# Patient Record
Sex: Male | Born: 1990 | Race: White | Hispanic: No | Marital: Single | Smoking: Current every day smoker
Health system: Southern US, Community
[De-identification: ages and names within clinical notes are randomized; demographics above are authoritative.]

## PROBLEM LIST (undated history)

## (undated) VITALS — BP 132/68 | HR 76 | Temp 98.0°F | Resp 16

## (undated) DIAGNOSIS — R0602 Shortness of breath: Secondary | ICD-10-CM

## (undated) DIAGNOSIS — I10 Essential (primary) hypertension: Secondary | ICD-10-CM

## (undated) DIAGNOSIS — F1920 Other psychoactive substance dependence, uncomplicated: Secondary | ICD-10-CM

## (undated) DIAGNOSIS — F419 Anxiety disorder, unspecified: Secondary | ICD-10-CM

## (undated) HISTORY — PX: NO PAST SURGERIES: SHX2092

## (undated) HISTORY — PX: INCISION / DRAINAGE HAND / FINGER: SUR695

## (undated) HISTORY — DX: Other psychoactive substance dependence, uncomplicated: F19.20

---
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
## 2009-12-01 ENCOUNTER — Emergency Department (HOSPITAL_COMMUNITY): Admission: EM | Admit: 2009-12-01 | Discharge: 2009-12-01 | Payer: Self-pay | Admitting: Family Medicine

## 2009-12-06 ENCOUNTER — Emergency Department (HOSPITAL_COMMUNITY): Admission: EM | Admit: 2009-12-06 | Discharge: 2009-12-06 | Payer: Self-pay | Admitting: Family Medicine

## 2010-03-13 ENCOUNTER — Emergency Department (HOSPITAL_COMMUNITY)
Admission: EM | Admit: 2010-03-13 | Discharge: 2010-03-13 | Disposition: A | Discharge status: Home / Self Care | Payer: Worker's Compensation | Attending: Emergency Medicine | Admitting: Emergency Medicine

## 2010-03-13 DIAGNOSIS — X19XXXA Contact with other heat and hot substances, initial encounter: Secondary | ICD-10-CM | POA: Insufficient documentation

## 2010-03-13 DIAGNOSIS — T23239A Burn of second degree of unspecified multiple fingers (nail), not including thumb, initial encounter: Secondary | ICD-10-CM | POA: Insufficient documentation

## 2010-03-13 DIAGNOSIS — Y929 Unspecified place or not applicable: Secondary | ICD-10-CM | POA: Insufficient documentation

## 2010-03-13 DIAGNOSIS — IMO0002 Reserved for concepts with insufficient information to code with codable children: Secondary | ICD-10-CM | POA: Insufficient documentation

## 2010-04-19 LAB — CULTURE, ROUTINE-ABSCESS
Culture: NO GROWTH
Gram Stain: NONE SEEN

## 2010-05-17 LAB — ANAEROBIC CULTURE

## 2010-05-17 LAB — WOUND CULTURE

## 2010-05-17 LAB — CBC
HCT: 35.7 % — ABNORMAL LOW (ref 39.0–52.0)
HCT: 43 % (ref 39.0–52.0)
Hemoglobin: 15.2 g/dL (ref 13.0–17.0)
MCV: 90.3 fL (ref 78.0–100.0)
Platelets: 157 10*3/uL (ref 150–400)
RBC: 3.95 MIL/uL — ABNORMAL LOW (ref 4.22–5.81)
RBC: 4.81 MIL/uL (ref 4.22–5.81)
WBC: 15.2 10*3/uL — ABNORMAL HIGH (ref 4.0–10.5)
WBC: 9.3 10*3/uL (ref 4.0–10.5)

## 2010-05-17 LAB — COMPREHENSIVE METABOLIC PANEL
BUN: 8 mg/dL (ref 6–23)
CO2: 26 mEq/L (ref 19–32)
Calcium: 9.4 mg/dL (ref 8.4–10.5)
Creatinine, Ser: 0.99 mg/dL (ref 0.4–1.5)
GFR calc non Af Amer: >60 mL/min (ref >60)
Glucose, Bld: 103 mg/dL — ABNORMAL HIGH (ref 70–99)
Total Protein: 7.7 g/dL (ref 6.0–8.3)

## 2010-05-17 LAB — DIFFERENTIAL
Eosinophils Relative: 0 % (ref 0–5)
Lymphocytes Relative: 4 % — ABNORMAL LOW (ref 12–46)
Lymphs Abs: 0.7 10*3/uL (ref 0.7–4.0)
Monocytes Relative: 8 % (ref 3–12)
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
## 2010-09-24 ENCOUNTER — Emergency Department (HOSPITAL_COMMUNITY)
Admission: EM | Admit: 2010-09-24 | Discharge: 2010-09-24 | Disposition: A | Discharge status: Home / Self Care | Payer: Managed Care, Other (non HMO) | Attending: Emergency Medicine | Admitting: Emergency Medicine

## 2010-09-24 DIAGNOSIS — R112 Nausea with vomiting, unspecified: Secondary | ICD-10-CM | POA: Insufficient documentation

## 2010-09-24 LAB — POCT I-STAT, CHEM 8
BUN: 11 mg/dL (ref 6–23)
Calcium, Ion: 1.16 mmol/L (ref 1.12–1.32)
Creatinine, Ser: 0.9 mg/dL (ref 0.50–1.35)
TCO2: 23 mmol/L (ref 0–100)

## 2010-12-05 ENCOUNTER — Inpatient Hospital Stay (INDEPENDENT_AMBULATORY_CARE_PROVIDER_SITE_OTHER)
Admission: RE | Admit: 2010-12-05 | Discharge: 2010-12-05 | Disposition: A | Discharge status: Home / Self Care | Payer: Managed Care, Other (non HMO) | Source: Ambulatory Visit | Attending: Family Medicine | Admitting: Family Medicine

## 2010-12-05 DIAGNOSIS — J029 Acute pharyngitis, unspecified: Secondary | ICD-10-CM

## 2010-12-05 LAB — POCT URINALYSIS DIP (DEVICE)
Bilirubin Urine: NEGATIVE
Glucose, UA: NEGATIVE mg/dL
Hgb urine dipstick: NEGATIVE
Specific Gravity, Urine: <=1.005 (ref 1.005–1.030)

## 2011-06-12 ENCOUNTER — Encounter (HOSPITAL_COMMUNITY): Payer: Self-pay | Admitting: *Deleted

## 2011-06-12 ENCOUNTER — Emergency Department (HOSPITAL_COMMUNITY)
Admission: EM | Admit: 2011-06-12 | Discharge: 2011-06-13 | Disposition: A | Discharge status: Home / Self Care | Payer: Managed Care, Other (non HMO) | Attending: Emergency Medicine | Admitting: Emergency Medicine

## 2011-06-12 DIAGNOSIS — R109 Unspecified abdominal pain: Secondary | ICD-10-CM | POA: Insufficient documentation

## 2011-06-12 DIAGNOSIS — R112 Nausea with vomiting, unspecified: Secondary | ICD-10-CM | POA: Insufficient documentation

## 2011-06-12 LAB — COMPREHENSIVE METABOLIC PANEL
ALT: 42 U/L (ref 0–53)
AST: 36 U/L (ref 0–37)
Albumin: 5.9 g/dL — ABNORMAL HIGH (ref 3.5–5.2)
Alkaline Phosphatase: 102 U/L (ref 39–117)
Calcium: 11.1 mg/dL — ABNORMAL HIGH (ref 8.4–10.5)
GFR calc Af Amer: >90 mL/min (ref >90)
Glucose, Bld: 150 mg/dL — ABNORMAL HIGH (ref 70–99)
Potassium: 3.6 mEq/L (ref 3.5–5.1)
Sodium: 140 mEq/L (ref 135–145)
Total Protein: 9.5 g/dL — ABNORMAL HIGH (ref 6.0–8.3)

## 2011-06-12 LAB — DIFFERENTIAL
Basophils Absolute: 0 10*3/uL (ref 0.0–0.1)
Basophils Relative: 0 % (ref 0–1)
Eosinophils Absolute: 0 10*3/uL (ref 0.0–0.7)
Eosinophils Relative: 0 % (ref 0–5)
Lymphs Abs: 1.6 10*3/uL (ref 0.7–4.0)
Neutrophils Relative %: 88 % — ABNORMAL HIGH (ref 43–77)

## 2011-06-12 LAB — CBC
MCH: 31.5 pg (ref 26.0–34.0)
MCHC: 36.9 g/dL — ABNORMAL HIGH (ref 30.0–36.0)
MCV: 85.3 fL (ref 78.0–100.0)
Platelets: 250 10*3/uL (ref 150–400)
RBC: 5.65 MIL/uL (ref 4.22–5.81)
RDW: 13.5 % (ref 11.5–15.5)

## 2011-06-12 LAB — URINALYSIS, ROUTINE W REFLEX MICROSCOPIC
Bilirubin Urine: NEGATIVE
Glucose, UA: NEGATIVE mg/dL
Ketones, ur: NEGATIVE mg/dL
Nitrite: NEGATIVE
Specific Gravity, Urine: 1.003 — ABNORMAL LOW (ref 1.005–1.030)
pH: 6.5 (ref 5.0–8.0)

## 2011-06-12 MED ORDER — ONDANSETRON HCL 4 MG/2ML IJ SOLN
4.0000 mg | Freq: Once | INTRAMUSCULAR | Status: AC
  Administered 2011-06-13: 4 mg via INTRAVENOUS
  Filled 2011-06-12: qty 2

## 2011-06-12 MED ORDER — ONDANSETRON 4 MG PO TBDP
8.0000 mg | ORAL_TABLET | Freq: Once | ORAL | Status: AC
  Administered 2011-06-12: 8 mg via ORAL
  Filled 2011-06-12: qty 2

## 2011-06-12 MED ORDER — SODIUM CHLORIDE 0.9 % IV BOLUS (SEPSIS)
1000.0000 mL | Freq: Once | INTRAVENOUS | Status: AC
  Administered 2011-06-13: 1000 mL via INTRAVENOUS

## 2011-06-12 NOTE — ED Notes (Signed)
abd pain na v and diarrhea since 1100am today

## 2011-06-13 ENCOUNTER — Encounter (HOSPITAL_COMMUNITY): Payer: Self-pay | Admitting: Radiology

## 2011-06-13 ENCOUNTER — Emergency Department (HOSPITAL_COMMUNITY): Payer: Managed Care, Other (non HMO)

## 2011-06-13 MED ORDER — PROMETHAZINE HCL 25 MG PO TABS: 25.0000 mg | ORAL_TABLET | Freq: Four times a day (QID) | ORAL | Status: DC | PRN

## 2011-06-13 MED ORDER — HYDROMORPHONE HCL PF 1 MG/ML IJ SOLN
1.0000 mg | Freq: Once | INTRAMUSCULAR | Status: AC
  Administered 2011-06-13: 1 mg via INTRAVENOUS
  Filled 2011-06-13: qty 1

## 2011-06-13 NOTE — ED Notes (Signed)
Patient with abdominal pain, more into flank area on left since 11am yesterday.  Patient having spasms and cramps and pain, radiating to right.  Patient does have some nausea and vomiting.

## 2011-06-13 NOTE — ED Provider Notes (Signed)
History     CSN: [id number]  Arrival date & time 06/12/11  2131   First MD Initiated Contact with Patient 06/12/11 2358      Chief Complaint  Patient presents with  . Emesis    (Consider location/radiation/quality/duration/timing/severity/associated sxs/prior treatment) HPI History provided by patient and his mother bedside. Since 11 AM has had nausea and vomiting with left-sided abdominal cramping and pains. Pain is severe and not radiating.  No hematuria. No bilious or bloody emesis. No diarrhea. No history of similar symptoms. No known sick contacts. No fevers. No medications. No recent travel. No recent antibiotics. Pain comes and goes. He tried over-the-counter antinausea medications without relief. No known bad food exposures.   History reviewed. No pertinent past medical history.  History reviewed. No pertinent past surgical history.  No family history on file.  History  Substance Use Topics  . Smoking status: Current Everyday Smoker  . Smokeless tobacco: Not on file  . Alcohol Use: No      Review of Systems  Constitutional: Negative for fever and chills.  HENT: Negative for sore throat, neck pain and neck stiffness.   Eyes: Negative for pain.  Respiratory: Negative for shortness of breath.   Cardiovascular: Negative for chest pain.  Gastrointestinal: Positive for nausea, vomiting and abdominal pain. Negative for blood in stool.  Genitourinary: Positive for flank pain. Negative for dysuria.  Musculoskeletal: Negative for back pain.  Skin: Negative for rash.  Neurological: Negative for headaches.  All other systems reviewed and are negative.    Allergies  Review of patient's allergies indicates no known allergies.  Home Medications   Current Outpatient Rx  Name Route Sig Dispense Refill  . ANTI-NAUSEA PO Oral Take 1 tablet by mouth as needed. For nausea      BP 129/60  Pulse 56  Temp(Src) 98.1 F (36.7 C) (Oral)  Resp 22  SpO2 99%  Physical Exam    Constitutional: He is oriented to person, place, and time. He appears well-developed and well-nourished.  HENT:  Head: Normocephalic and atraumatic.  Eyes: Conjunctivae and EOM are normal. Pupils are equal, round, and reactive to light.  Neck: Trachea normal. Neck supple. No thyromegaly present.  Cardiovascular: Normal rate, regular rhythm, S1 normal, S2 normal and normal pulses.     No systolic murmur is present   No diastolic murmur is present  Pulses:      Radial pulses are 2+ on the right side, and 2+ on the left side.  Pulmonary/Chest: Effort normal and breath sounds normal. He has no wheezes. He has no rhonchi. He has no rales. He exhibits no tenderness.  Abdominal: Soft. Normal appearance and bowel sounds are normal. He exhibits no mass. There is no tenderness. There is no rebound, no guarding, no CVA tenderness and negative Murphy's sign.       Localizes discomfort to left flank without reproducible tenderness, writhing on the bed unable to get comfortable  Musculoskeletal:       BLE:s Calves nontender, no cords or erythema, negative Homans sign  Neurological: He is alert and oriented to person, place, and time. He has normal strength. No cranial nerve deficit or sensory deficit. GCS eye subscore is 4. GCS verbal subscore is 5. GCS motor subscore is 6.  Skin: Skin is warm and dry. No rash noted. He is not diaphoretic.  Psychiatric: His speech is normal.       Cooperative and appropriate    ED Course  Procedures (including critical care time)  IV fluids. IV Dilaudid. CT scan obtained and reviewed as below. On recheck at 2:30 AM is feeling significantly better without pain and no emesis in ED. Nausea resolved.  Results for orders placed during the hospital encounter of 06/12/11  CBC      Component Value Range   WBC 19.9 (*) 4.0 - 10.5 (K/uL)   RBC 5.65  4.22 - 5.81 (MIL/uL)   Hemoglobin 17.8 (*) 13.0 - 17.0 (g/dL)   HCT 16.1  09.6 - 04.5 (%)   MCV 85.3  78.0 - 100.0 (fL)    MCH 31.5  26.0 - 34.0 (pg)   MCHC 36.9 (*) 30.0 - 36.0 (g/dL)   RDW 40.9  81.1 - 91.4 (%)   Platelets 250  150 - 400 (K/uL)  DIFFERENTIAL      Component Value Range   Neutrophils Relative 88 (*) 43 - 77 (%)   Neutro Abs 17.4 (*) 1.7 - 7.7 (K/uL)   Lymphocytes Relative 8 (*) 12 - 46 (%)   Lymphs Abs 1.6  0.7 - 4.0 (K/uL)   Monocytes Relative 4  3 - 12 (%)   Monocytes Absolute 0.8  0.1 - 1.0 (K/uL)   Eosinophils Relative 0  0 - 5 (%)   Eosinophils Absolute 0.0  0.0 - 0.7 (K/uL)   Basophils Relative 0  0 - 1 (%)   Basophils Absolute 0.0  0.0 - 0.1 (K/uL)  COMPREHENSIVE METABOLIC PANEL      Component Value Range   Sodium 140  135 - 145 (mEq/L)   Potassium 3.6  3.5 - 5.1 (mEq/L)   Chloride 96  96 - 112 (mEq/L)   CO2 17 (*) 19 - 32 (mEq/L)   Glucose, Bld 150 (*) 70 - 99 (mg/dL)   BUN 20  6 - 23 (mg/dL)   Creatinine, Ser 7.82  0.50 - 1.35 (mg/dL)   Calcium 95.6 (*) 8.4 - 10.5 (mg/dL)   Total Protein 9.5 (*) 6.0 - 8.3 (g/dL)   Albumin 5.9 (*) 3.5 - 5.2 (g/dL)   AST 36  0 - 37 (U/L)   ALT 42  0 - 53 (U/L)   Alkaline Phosphatase 102  39 - 117 (U/L)   Total Bilirubin 0.3  0.3 - 1.2 (mg/dL)   GFR calc non Af Amer 89 (*) >90 (mL/min)   GFR calc Af Amer >90  >90 (mL/min)  URINALYSIS, ROUTINE W REFLEX MICROSCOPIC      Component Value Range   Color, Urine YELLOW  YELLOW    APPearance CLEAR  CLEAR    Specific Gravity, Urine 1.003 (*) 1.005 - 1.030    pH 6.5  5.0 - 8.0    Glucose, UA NEGATIVE  NEGATIVE (mg/dL)   Hgb urine dipstick NEGATIVE  NEGATIVE    Bilirubin Urine NEGATIVE  NEGATIVE    Ketones, ur NEGATIVE  NEGATIVE (mg/dL)   Protein, ur NEGATIVE  NEGATIVE (mg/dL)   Urobilinogen, UA 0.2  0.0 - 1.0 (mg/dL)   Nitrite NEGATIVE  NEGATIVE    Leukocytes, UA NEGATIVE  NEGATIVE    Ct Abdomen Pelvis Wo Contrast  06/13/2011  *RADIOLOGY REPORT*  Clinical Data: Left flank pain.  CT ABDOMEN AND PELVIS WITHOUT CONTRAST  Technique:  Multidetector CT imaging of the abdomen and pelvis was performed  following the standard protocol without intravenous contrast.  Comparison: None.  Findings: No focal abnormalities seen in the liver or spleen.  The stomach, duodenum, pancreas, gallbladder, and adrenal glands are unremarkable.  No stones are seen in either kidney.  No secondary changes are seen in either kidney.  No ureteral or bladder stones.  No abdominal aortic aneurysm.  There is no free fluid or lymphadenopathy in the abdomen.  Abdominal bowel loops are unremarkable.  Imaging through the pelvis shows no free intraperitoneal fluid.  No pelvic sidewall lymphadenopathy.  Colon is unremarkable aside from a few scattered diverticuli.  No colonic diverticulitis.  The terminal ileum is normal.  The appendix is normal.  Bone windows reveal no worrisome lytic or sclerotic osseous lesions.  IMPRESSION: No acute findings in the abdomen or pelvis.  No findings to explain the patient's history of left flank pain.  Original Report Authenticated By: ERIC A. MANSELL, M.D.       MDM   Nausea vomiting with abdominal flank pain. Evaluated with labs and CT scan as above. On recheck is much improved and requesting to be discharged home. Prescription for Zofran provided. Reliable historian verbalizes understanding strict return precautions for any worsening condition and agrees to discharge and followup instructions. Plan clear liquids advance to brat diet. Return here for persistent abdominal pain. PCP referral provided        Sunnie Nielsen, MD 06/13/11 [phone]

## 2011-06-13 NOTE — ED Notes (Signed)
Patient able to keep oral fluids and ice chips down.  No nausea or vomiting.

## 2011-06-13 NOTE — Discharge Instructions (Signed)
Abdominal Pain Abdominal pain can be caused by many things. Your caregiver decides the seriousness of your pain by an examination and possibly blood tests and X-rays. Many cases can be observed and treated at home. Most abdominal pain is not caused by a disease and will probably improve without treatment. However, in many cases, more time must pass before a clear cause of the pain can be found. Before that point, it may not be known if you need more testing, or if hospitalization or surgery is needed. HOME CARE INSTRUCTIONS   Do not take laxatives unless directed by your caregiver.   Take pain medicine only as directed by your caregiver.   Only take over-the-counter or prescription medicines for pain, discomfort, or fever as directed by your caregiver.   Try a clear liquid diet (broth, tea, or water) for as long as directed by your caregiver. Slowly move to a bland diet as tolerated.  SEEK IMMEDIATE MEDICAL CARE IF:   The pain does not go away.   You have a fever.   You keep throwing up (vomiting).   The pain is felt only in portions of the abdomen. Pain in the right side could possibly be appendicitis. In an adult, pain in the left lower portion of the abdomen could be colitis or diverticulitis.   You pass bloody or black tarry stools.  MAKE SURE YOU:   Understand these instructions.   Will watch your condition.   Will get help right away if you are not doing well or get worse.    RESOURCE GUIDE  No Primary Care Doctor Call [facility]  [phone] Other agencies that provide inexpensive medical care    [facility]  [phone]    [facility]  [phone]    [facility]  [phone]    [facility]  [phone]    [facility]  [phone]    [facility]  [phone]

## 2011-06-27 ENCOUNTER — Emergency Department (HOSPITAL_COMMUNITY)
Admission: EM | Admit: 2011-06-27 | Discharge: 2011-06-27 | Disposition: A | Discharge status: Home / Self Care | Payer: Managed Care, Other (non HMO) | Attending: Emergency Medicine | Admitting: Emergency Medicine

## 2011-06-27 ENCOUNTER — Encounter (HOSPITAL_COMMUNITY): Payer: Self-pay | Admitting: Emergency Medicine

## 2011-06-27 DIAGNOSIS — R112 Nausea with vomiting, unspecified: Secondary | ICD-10-CM | POA: Insufficient documentation

## 2011-06-27 DIAGNOSIS — R109 Unspecified abdominal pain: Secondary | ICD-10-CM | POA: Insufficient documentation

## 2011-06-27 DIAGNOSIS — F1210 Cannabis abuse, uncomplicated: Secondary | ICD-10-CM | POA: Insufficient documentation

## 2011-06-27 DIAGNOSIS — F17200 Nicotine dependence, unspecified, uncomplicated: Secondary | ICD-10-CM | POA: Insufficient documentation

## 2011-06-27 LAB — CBC
HCT: 47 % (ref 39.0–52.0)
Hemoglobin: 16.8 g/dL (ref 13.0–17.0)
MCH: 31 pg (ref 26.0–34.0)
RBC: 5.42 MIL/uL (ref 4.22–5.81)

## 2011-06-27 LAB — URINE MICROSCOPIC-ADD ON

## 2011-06-27 LAB — DIFFERENTIAL
Eosinophils Absolute: 0 10*3/uL (ref 0.0–0.7)
Lymphs Abs: 1.7 10*3/uL (ref 0.7–4.0)
Monocytes Absolute: 1.1 10*3/uL — ABNORMAL HIGH (ref 0.1–1.0)
Monocytes Relative: 6 % (ref 3–12)
Neutro Abs: 16.9 10*3/uL — ABNORMAL HIGH (ref 1.7–7.7)
Neutrophils Relative %: 86 % — ABNORMAL HIGH (ref 43–77)

## 2011-06-27 LAB — RAPID URINE DRUG SCREEN, HOSP PERFORMED
Barbiturates: NOT DETECTED
Benzodiazepines: POSITIVE — AB

## 2011-06-27 LAB — COMPREHENSIVE METABOLIC PANEL
Alkaline Phosphatase: 88 U/L (ref 39–117)
BUN: 15 mg/dL (ref 6–23)
Chloride: 104 mEq/L (ref 96–112)
Creatinine, Ser: 1.17 mg/dL (ref 0.50–1.35)
GFR calc Af Amer: >90 mL/min (ref >90)
Glucose, Bld: 123 mg/dL — ABNORMAL HIGH (ref 70–99)
Potassium: 3.5 mEq/L (ref 3.5–5.1)
Total Bilirubin: 0.3 mg/dL (ref 0.3–1.2)

## 2011-06-27 LAB — URINALYSIS, ROUTINE W REFLEX MICROSCOPIC
Ketones, ur: >80 mg/dL — AB
Nitrite: NEGATIVE
Protein, ur: 100 mg/dL — AB
Urobilinogen, UA: 0.2 mg/dL (ref 0.0–1.0)

## 2011-06-27 LAB — LIPASE, BLOOD: Lipase: 23 U/L (ref 11–59)

## 2011-06-27 MED ORDER — ONDANSETRON HCL 4 MG/2ML IJ SOLN
4.0000 mg | Freq: Once | INTRAMUSCULAR | Status: AC
  Administered 2011-06-27: 4 mg via INTRAVENOUS
  Filled 2011-06-27: qty 2

## 2011-06-27 MED ORDER — HYDROMORPHONE HCL PF 1 MG/ML IJ SOLN
1.0000 mg | Freq: Once | INTRAMUSCULAR | Status: AC
  Administered 2011-06-27: 1 mg via INTRAVENOUS
  Filled 2011-06-27: qty 1

## 2011-06-27 MED ORDER — SODIUM CHLORIDE 0.9 % IV SOLN
INTRAVENOUS | Status: DC
Start: 2011-06-27 — End: 2011-06-28
  Administered 2011-06-27: 18:00:00 via INTRAVENOUS

## 2011-06-27 MED ORDER — ONDANSETRON HCL 4 MG/2ML IJ SOLN
INTRAMUSCULAR | Status: AC
  Administered 2011-06-27: 4 mg
  Filled 2011-06-27: qty 2

## 2011-06-27 MED ORDER — OXYCODONE-ACETAMINOPHEN 5-325 MG PO TABS: 1.0000 | ORAL_TABLET | ORAL | Status: AC | PRN

## 2011-06-27 MED ORDER — HYDROMORPHONE HCL PF 1 MG/ML IJ SOLN
0.5000 mg | Freq: Once | INTRAMUSCULAR | Status: AC
  Administered 2011-06-27: 0.5 mg via INTRAVENOUS
  Filled 2011-06-27: qty 1

## 2011-06-27 MED ORDER — SODIUM CHLORIDE 0.9 % IV BOLUS (SEPSIS)
2000.0000 mL | Freq: Once | INTRAVENOUS | Status: AC
  Administered 2011-06-27: 2000 mL via INTRAVENOUS

## 2011-06-27 NOTE — ED Notes (Signed)
Pt is actively dry heaving. 

## 2011-06-27 NOTE — ED Notes (Signed)
b

## 2011-06-27 NOTE — ED Notes (Signed)
Pt presenting to ed with c/o abdominal pain with positive nausea and vomiting and small amount of diarrhea since 5:00am. Pt is alert and oriented at this time. Pt states he was here for the same x 1 week ago.

## 2011-06-27 NOTE — ED Provider Notes (Signed)
History     CSN: [id number]  Arrival date & time 06/27/11  1531   First MD Initiated Contact with Patient 06/27/11 1645      Chief Complaint  Patient presents with  . Abdominal Pain  . Nausea    (Consider location/radiation/quality/duration/timing/severity/associated sxs/prior treatment) HPI Comments: Carlos Lester is a 21 y.o. Male who presents with nausea, vomiting, and abdominal pain that started at 5 AM today. He was in the ED on 06/12/11 with a similar problem. He did not have recurrence after the discharge until today. He denies use of illegal substances, alcohol or tobacco. He denies fever, or back pain, cough, shortness of breath, or chest pain. He tried Phenergan at home, but was unable to keep down.  Patient is a 21 y.o. male presenting with abdominal pain. The history is provided by the patient.  Abdominal Pain The primary symptoms of the illness include abdominal pain.    History reviewed. No pertinent past medical history.  History reviewed. No pertinent past surgical history.  No family history on file.  History  Substance Use Topics  . Smoking status: Current Everyday Smoker -- 1.0 packs/day  . Smokeless tobacco: Not on file  . Alcohol Use: No      Review of Systems  Gastrointestinal: Positive for abdominal pain.  All other systems reviewed and are negative.    Allergies  Review of patient's allergies indicates no known allergies.  Home Medications   Current Outpatient Rx  Name Route Sig Dispense Refill  . PROMETHAZINE HCL 25 MG PO TABS Oral Take 25 mg by mouth every 6 (six) hours as needed. For nausea.    . OXYCODONE-ACETAMINOPHEN 5-325 MG PO TABS Oral Take 1 tablet by mouth every 4 (four) hours as needed for pain. 20 tablet 0    BP 122/62  Pulse 84  Temp(Src) 98.2 F (36.8 C) (Axillary)  Resp 17  SpO2 98%  Physical Exam  Nursing note and vitals reviewed. Constitutional: He is oriented to person, place, and time. He appears  well-developed and well-nourished.  HENT:  Head: Normocephalic and atraumatic.  Right Ear: External ear normal.  Left Ear: External ear normal.  Eyes: Conjunctivae and EOM are normal. Pupils are equal, round, and reactive to light.  Neck: Normal range of motion and phonation normal. Neck supple.  Cardiovascular: Normal rate, regular rhythm, normal heart sounds and intact distal pulses.   Pulmonary/Chest: Effort normal and breath sounds normal. He exhibits no bony tenderness.  Abdominal: Soft. Normal appearance and bowel sounds are normal. He exhibits no distension. There is no tenderness. There is no guarding.       No abdominal tenderness  Musculoskeletal: Normal range of motion.  Neurological: He is alert and oriented to person, place, and time. He has normal strength. No cranial nerve deficit or sensory deficit. He exhibits normal muscle tone. Coordination normal.  Skin: Skin is warm, dry and intact.  Psychiatric: His behavior is normal. Judgment and thought content normal.       Anxious    ED Course  Procedures (including critical care time)  Patient was treated with IV fluids, Dilaudid, and Zosyn..  Patient improved in his pain. He was asked about the positive drug screen for benzodiazepines and states that he took a single, Xanax, a friend gave him. He denies ongoing Xanax abuse.  Labs Reviewed  CBC - Abnormal; Notable for the following:    WBC 19.7 (*)    All other components within normal limits  DIFFERENTIAL -  Abnormal; Notable for the following:    Neutrophils Relative 86 (*)    Neutro Abs 16.9 (*)    Lymphocytes Relative 9 (*)    Monocytes Absolute 1.1 (*)    All other components within normal limits  COMPREHENSIVE METABOLIC PANEL - Abnormal; Notable for the following:    CO2 17 (*)    Glucose, Bld 123 (*)    Total Protein 8.5 (*)    GFR calc non Af Amer 88 (*)    All other components within normal limits  URINALYSIS, ROUTINE W REFLEX MICROSCOPIC - Abnormal;  Notable for the following:    Color, Urine AMBER (*) BIOCHEMICALS MAY BE AFFECTED BY COLOR   APPearance CLOUDY (*)    Specific Gravity, Urine 1.032 (*)    Bilirubin Urine SMALL (*)    Ketones, ur >80 (*)    Protein, ur 100 (*)    Leukocytes, UA TRACE (*)    All other components within normal limits  URINE RAPID DRUG SCREEN (HOSP PERFORMED) - Abnormal; Notable for the following:    Benzodiazepines POSITIVE (*)    Tetrahydrocannabinol POSITIVE (*)    All other components within normal limits  URINE MICROSCOPIC-ADD ON - Abnormal; Notable for the following:    Casts HYALINE CASTS (*)    All other components within normal limits  LIPASE, BLOOD  LAB REPORT - SCANNED   No results found.   1. Abdominal pain       MDM  Nonspecific recurrent abdominal pain, with recent negative CAT scan and negative. ED evaluation today. Doubt colitis, appendicitis, urinary tract infection, metabolic instability or serious bacterial infection. He is stable for discharge.   Plan: Home Medications- Percocet; Home Treatments- gradual diet increase; Recommended follow up- GI f/u      Flint Melter, MD 06/28/11 1539

## 2011-06-27 NOTE — Discharge Instructions (Signed)

## 2011-06-27 NOTE — ED Notes (Signed)
[id number]> Expected date:<BR> Expected time: 3:22 PM<BR> Means of arrival:<BR> Comments:<BR> M11 - 20yoM RLQ Abd pain, vomiting

## 2011-06-27 NOTE — ED Notes (Signed)
Blood work drawn, 1 blue, 1 light green, 1 lavender, 1 dark green drawn

## 2011-06-27 NOTE — ED Notes (Signed)
Patient aware of need for urine specimen. Patient unable to void at this time. Patient given urinal. Encouraged to call for assistance if needed.   

## 2011-10-01 ENCOUNTER — Emergency Department (HOSPITAL_COMMUNITY)
Admission: EM | Admit: 2011-10-01 | Discharge: 2011-10-01 | Disposition: A | Discharge status: Home / Self Care | Payer: Managed Care, Other (non HMO) | Attending: Emergency Medicine | Admitting: Emergency Medicine

## 2011-10-01 ENCOUNTER — Inpatient Hospital Stay (HOSPITAL_COMMUNITY)
Admission: RE | Admit: 2011-10-01 | Discharge: 2011-10-06 | DRG: 897 | Disposition: A | Discharge status: Home / Self Care | Payer: Managed Care, Other (non HMO) | Attending: Psychiatry | Admitting: Psychiatry

## 2011-10-01 ENCOUNTER — Encounter (HOSPITAL_COMMUNITY): Payer: Self-pay | Admitting: *Deleted

## 2011-10-01 ENCOUNTER — Encounter (HOSPITAL_COMMUNITY): Payer: Self-pay | Admitting: Licensed Clinical Social Worker

## 2011-10-01 DIAGNOSIS — F1310 Sedative, hypnotic or anxiolytic abuse, uncomplicated: Secondary | ICD-10-CM | POA: Insufficient documentation

## 2011-10-01 DIAGNOSIS — F1410 Cocaine abuse, uncomplicated: Secondary | ICD-10-CM | POA: Insufficient documentation

## 2011-10-01 DIAGNOSIS — F1020 Alcohol dependence, uncomplicated: Secondary | ICD-10-CM | POA: Diagnosis present

## 2011-10-01 DIAGNOSIS — F17200 Nicotine dependence, unspecified, uncomplicated: Secondary | ICD-10-CM | POA: Insufficient documentation

## 2011-10-01 DIAGNOSIS — F3289 Other specified depressive episodes: Secondary | ICD-10-CM | POA: Diagnosis present

## 2011-10-01 DIAGNOSIS — F1910 Other psychoactive substance abuse, uncomplicated: Secondary | ICD-10-CM | POA: Diagnosis present

## 2011-10-01 DIAGNOSIS — F909 Attention-deficit hyperactivity disorder, unspecified type: Secondary | ICD-10-CM | POA: Diagnosis present

## 2011-10-01 DIAGNOSIS — F1994 Other psychoactive substance use, unspecified with psychoactive substance-induced mood disorder: Principal | ICD-10-CM | POA: Diagnosis present

## 2011-10-01 DIAGNOSIS — I10 Essential (primary) hypertension: Secondary | ICD-10-CM | POA: Insufficient documentation

## 2011-10-01 DIAGNOSIS — F329 Major depressive disorder, single episode, unspecified: Secondary | ICD-10-CM | POA: Diagnosis present

## 2011-10-01 DIAGNOSIS — F19939 Other psychoactive substance use, unspecified with withdrawal, unspecified: Secondary | ICD-10-CM | POA: Diagnosis not present

## 2011-10-01 DIAGNOSIS — F1210 Cannabis abuse, uncomplicated: Secondary | ICD-10-CM | POA: Insufficient documentation

## 2011-10-01 DIAGNOSIS — Z0000 Encounter for general adult medical examination without abnormal findings: Secondary | ICD-10-CM

## 2011-10-01 DIAGNOSIS — F1320 Sedative, hypnotic or anxiolytic dependence, uncomplicated: Secondary | ICD-10-CM | POA: Diagnosis present

## 2011-10-01 HISTORY — DX: Anxiety disorder, unspecified: F41.9

## 2011-10-01 HISTORY — DX: Shortness of breath: R06.02

## 2011-10-01 HISTORY — DX: Essential (primary) hypertension: I10

## 2011-10-01 LAB — RAPID URINE DRUG SCREEN, HOSP PERFORMED
Barbiturates: NOT DETECTED
Cocaine: POSITIVE — AB
Tetrahydrocannabinol: POSITIVE — AB

## 2011-10-01 LAB — URINALYSIS, ROUTINE W REFLEX MICROSCOPIC
Bilirubin Urine: NEGATIVE
Hgb urine dipstick: NEGATIVE
Ketones, ur: NEGATIVE mg/dL
Specific Gravity, Urine: 1.035 — ABNORMAL HIGH (ref 1.005–1.030)
Urobilinogen, UA: 0.2 mg/dL (ref 0.0–1.0)

## 2011-10-01 LAB — CBC
HCT: 43.5 % (ref 39.0–52.0)
MCHC: 36.1 g/dL — ABNORMAL HIGH (ref 30.0–36.0)
RDW: 13.2 % (ref 11.5–15.5)

## 2011-10-01 LAB — BASIC METABOLIC PANEL
BUN: 16 mg/dL (ref 6–23)
GFR calc Af Amer: >90 mL/min (ref >90)
GFR calc non Af Amer: >90 mL/min (ref >90)
Potassium: 3.4 mEq/L — ABNORMAL LOW (ref 3.5–5.1)

## 2011-10-01 MED ORDER — ACETAMINOPHEN 325 MG PO TABS
650.0000 mg | ORAL_TABLET | Freq: Once | ORAL | Status: AC
  Administered 2011-10-01: 650 mg via ORAL
  Filled 2011-10-01: qty 2

## 2011-10-01 MED ORDER — NICOTINE 14 MG/24HR TD PT24: 14.0000 mg | MEDICATED_PATCH | Freq: Once | TRANSDERMAL | Status: DC

## 2011-10-01 MED ORDER — NICOTINE 21 MG/24HR TD PT24
MEDICATED_PATCH | TRANSDERMAL | Status: AC
  Administered 2011-10-01: 21 mg
  Filled 2011-10-01: qty 1

## 2011-10-01 NOTE — ED Notes (Signed)
Pt sts he has been taking benzos for 5 years and he is tired of living "this lifestyle." When asked what type he takes, he sts "anything that I can get my hands on." Patient sts the last time he took anything was 2 am this morning.
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
## 2011-10-01 NOTE — ED Notes (Signed)
Report called to [facility] and [place] states ok to send pt over.  Security notified and pt waiting for transfer

## 2011-10-02 ENCOUNTER — Encounter (HOSPITAL_COMMUNITY): Payer: Self-pay | Admitting: *Deleted

## 2011-10-02 DIAGNOSIS — F1320 Sedative, hypnotic or anxiolytic dependence, uncomplicated: Secondary | ICD-10-CM | POA: Diagnosis present

## 2011-10-02 LAB — HEPATIC FUNCTION PANEL
Albumin: 4.4 g/dL (ref 3.5–5.2)
Bilirubin, Direct: 0.1 mg/dL (ref 0.0–0.3)
Total Bilirubin: 0.4 mg/dL (ref 0.3–1.2)

## 2011-10-02 LAB — ACETAMINOPHEN LEVEL: Acetaminophen (Tylenol), Serum: <15 ug/mL (ref 10–30)

## 2011-10-02 LAB — SALICYLATE LEVEL: Salicylate Lvl: <2 mg/dL — ABNORMAL LOW (ref 2.8–20.0)

## 2011-10-02 LAB — TSH: TSH: 1.691 u[IU]/mL (ref 0.350–4.500)

## 2011-10-02 MED ORDER — CHLORDIAZEPOXIDE HCL 25 MG PO CAPS
25.0000 mg | ORAL_CAPSULE | Freq: Four times a day (QID) | ORAL | Status: AC | PRN
  Administered 2011-10-02 – 2011-10-04 (×3): 25 mg via ORAL
  Filled 2011-10-02 (×2): qty 1

## 2011-10-02 MED ORDER — ADULT MULTIVITAMIN W/MINERALS CH
1.0000 | ORAL_TABLET | Freq: Every day | ORAL | Status: DC
  Administered 2011-10-02: 1 via ORAL
  Filled 2011-10-02 (×2): qty 1

## 2011-10-02 MED ORDER — MAGNESIUM HYDROXIDE 400 MG/5ML PO SUSP: 30.0000 mL | Freq: Every day | ORAL | Status: DC | PRN

## 2011-10-02 MED ORDER — ONDANSETRON 4 MG PO TBDP: 4.0000 mg | ORAL_TABLET | Freq: Four times a day (QID) | ORAL | Status: AC | PRN

## 2011-10-02 MED ORDER — CITALOPRAM HYDROBROMIDE 20 MG PO TABS
20.0000 mg | ORAL_TABLET | Freq: Every day | ORAL | Status: DC
  Administered 2011-10-02 – 2011-10-06 (×5): 20 mg via ORAL
  Filled 2011-10-02 (×8): qty 1

## 2011-10-02 MED ORDER — HYDROXYZINE HCL 25 MG PO TABS
25.0000 mg | ORAL_TABLET | Freq: Four times a day (QID) | ORAL | Status: DC | PRN
  Administered 2011-10-02 – 2011-10-03 (×4): 25 mg via ORAL

## 2011-10-02 MED ORDER — CHLORDIAZEPOXIDE HCL 25 MG PO CAPS
25.0000 mg | ORAL_CAPSULE | Freq: Four times a day (QID) | ORAL | Status: AC
  Administered 2011-10-02 (×3): 25 mg via ORAL
  Filled 2011-10-02 (×3): qty 1

## 2011-10-02 MED ORDER — VITAMIN B-1 100 MG PO TABS
100.0000 mg | ORAL_TABLET | Freq: Every day | ORAL | Status: DC
  Filled 2011-10-02: qty 1

## 2011-10-02 MED ORDER — VITAMIN B-1 100 MG PO TABS
100.0000 mg | ORAL_TABLET | Freq: Every day | ORAL | Status: DC
  Administered 2011-10-03 – 2011-10-06 (×4): 100 mg via ORAL
  Filled 2011-10-02 (×6): qty 1

## 2011-10-02 MED ORDER — ACETAMINOPHEN 325 MG PO TABS
650.0000 mg | ORAL_TABLET | Freq: Four times a day (QID) | ORAL | Status: DC | PRN
  Administered 2011-10-02 – 2011-10-03 (×4): 650 mg via ORAL

## 2011-10-02 MED ORDER — LOPERAMIDE HCL 2 MG PO CAPS: 2.0000 mg | ORAL_CAPSULE | ORAL | Status: AC | PRN

## 2011-10-02 MED ORDER — ADULT MULTIVITAMIN W/MINERALS CH
1.0000 | ORAL_TABLET | Freq: Every day | ORAL | Status: DC
  Administered 2011-10-03 – 2011-10-06 (×4): 1 via ORAL
  Filled 2011-10-02 (×7): qty 1

## 2011-10-02 MED ORDER — TRAZODONE HCL 50 MG PO TABS
50.0000 mg | ORAL_TABLET | Freq: Once | ORAL | Status: AC
  Administered 2011-10-02: 50 mg via ORAL
  Filled 2011-10-02 (×2): qty 1

## 2011-10-02 MED ORDER — NICOTINE 21 MG/24HR TD PT24
21.0000 mg | MEDICATED_PATCH | Freq: Every day | TRANSDERMAL | Status: DC
  Administered 2011-10-02 – 2011-10-05 (×5): 21 mg via TRANSDERMAL
  Filled 2011-10-02 (×7): qty 1

## 2011-10-02 MED ORDER — ALUM & MAG HYDROXIDE-SIMETH 200-200-20 MG/5ML PO SUSP
30.0000 mL | ORAL | Status: DC | PRN
  Administered 2011-10-03: 30 mL via ORAL

## 2011-10-02 MED ORDER — CHLORDIAZEPOXIDE HCL 25 MG PO CAPS
25.0000 mg | ORAL_CAPSULE | Freq: Three times a day (TID) | ORAL | Status: AC
  Administered 2011-10-03 (×3): 25 mg via ORAL
  Filled 2011-10-02 (×4): qty 1

## 2011-10-02 MED ORDER — TRAZODONE HCL 50 MG PO TABS
50.0000 mg | ORAL_TABLET | Freq: Every evening | ORAL | Status: DC | PRN
  Administered 2011-10-02 – 2011-10-05 (×5): 50 mg via ORAL
  Filled 2011-10-02 (×13): qty 1

## 2011-10-02 MED ORDER — CHLORDIAZEPOXIDE HCL 25 MG PO CAPS
25.0000 mg | ORAL_CAPSULE | Freq: Every day | ORAL | Status: AC
  Administered 2011-10-05: 25 mg via ORAL
  Filled 2011-10-02: qty 1

## 2011-10-02 MED ORDER — CHLORDIAZEPOXIDE HCL 25 MG PO CAPS
25.0000 mg | ORAL_CAPSULE | ORAL | Status: AC
  Administered 2011-10-04 (×2): 25 mg via ORAL
  Filled 2011-10-02 (×2): qty 1

## 2011-10-02 MED ORDER — THIAMINE HCL 100 MG/ML IJ SOLN: 100.0000 mg | Freq: Once | INTRAMUSCULAR | Status: DC

## 2011-10-02 NOTE — Progress Notes (Signed)
Pt reported poor sleep at 2-3 hours.  He stated,"It put me to sleep but didn't keep me asleep"  It was recorded as 4 hours per MHT checks.  He rated his depression hopelessness and anxiety all an 8 on huis self-inventory.  He reports appetite as poor energy low but his ability to pay attention as good.  He denied any symptoms of withdrawal except for agitation and cravings.  As the day progressed, he was started on the librium protocol which he feels has been helpful.  His CIWA was a 3 at 1800.  He denied any S/I or A/V hallucinations.  He is open to to  The idea of going into long-term treatment if possible.  He is unsure what he can do at this point for he has some pending court dates.  Dr. Kirtland Bouchard asked him to talk with his lawyer to see what they could work out then we would go from there.  Pt did voice understanding.

## 2011-10-02 NOTE — Progress Notes (Signed)
[facility] Group Notes:  (Counselor/Nursing/MHT/Case Management/Adjunct)  10/02/2011 2:12 PM  Type of Therapy:  Psychoeducational Skills  Participation Level:  Minimal  Participation Quality:  Appropriate and Attentive  Affect:  Blunted  Cognitive:  Appropriate and Oriented  Insight:  Limited  Engagement in Group:  Limited  Engagement in Therapy:  n/a  Modes of Intervention:  Activity, Education, Problem-solving, Socialization and Support  Summary of Progress/Problems: Carlos Lester attended Psychoeducational group that focused on using quality time with support systems/individuals to engage in healthy coping skills. Carlos Lester participated in activity guessing about self and peers. Carlos Lester was minimally active while group discussed who their supports are, how they can spend positive quality time with them as a coping skill and a way to strengthen their relationship. [name] was given a homework assignment to find two ways to improve his support systems and 20 activities he can do to spend quality time with supports.      Wandra Scot 10/02/2011, 2:12 PM

## 2011-10-02 NOTE — Progress Notes (Signed)
Patient ID: Carlos Lester, male   DOB: [birth year], 21 y.o.   MRN: [id number]  Pt was pleasant but anxious during the adm process. Pt stated that he's also depressed. Pt informed the writer that he's been abusing benzo's, cocaine, and THC. Pt informed the writer that he has crt date pending Sept 6 for possession of stolen veh, poss of stolen goods, larceny of a veh, and DUI, "I'm just tired of being tired".  Locker 15 used for belongings.

## 2011-10-02 NOTE — Progress Notes (Signed)
Psychoeducational Group Note  Date:  10/02/2011 Time:  1100  Group Topic/Focus:  Recovery Goals:   The focus of this group is to identify appropriate goals for recovery and establish a plan to achieve them.  Participation Level:  Active  Participation Quality:  Appropriate and Attentive  Affect:  Appropriate  Cognitive:  Appropriate  Insight:  Good  Engagement in Group:  Good  Additional Comments: Pt participated in recovery goals group. Pt defined what recovery is. Pt also was given a personal recovery goals worksheet. Pt worked on the worksheet and wrote what changes needed to be made and set a goal toward the change. Pt was asked to make specific, measurable goals that would be realistic to accomplish. Pt shared and was cooperative during group.   Karleen Hampshire Brittini 10/02/2011, 1:02 PM

## 2011-10-02 NOTE — Progress Notes (Signed)
Patient ID: Carlos Lester, male   DOB: 11/06/1990, 21 y.o.   MRN: [id number]  D:  Pt approached the writer while the writer was in the room with another pt. "I need to speak with you as soon as you get a chance."  Pt approached writer with his hands outstretched and shaking, saying "I don't know what's wrong with me". Writer encouraged pt to sit down and attempt to discuss what may be causing his issue. Offered prn vistaril. Pt immediately stopped trembling as he walked to the window.  A: Adm prn vistaril, and informed pt that in the future it isn't necessary for him to exaggerate his condition. Pt stated, "The other nurse told me I didn't have any".  Encouragement and support was offered. 15 min checks were continued for safety.  R:  Pt remains safe.

## 2011-10-02 NOTE — ED Provider Notes (Signed)
Medical screening examination/treatment/procedure(s) were performed by non-physician practitioner and as supervising physician I was immediately available for consultation/collaboration.  Toy Baker, MD 10/02/11 [phone]

## 2011-10-02 NOTE — Discharge Planning (Signed)
New patient attended AM group, good participation.  States he has been using alcohol and benzos for the past 4 years.  Only intervention was a day reporting center that was set up through probation that [place] attended twice a week.  He states he was not motivated for change at the time.  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  He has court on Sept 6 on a felony charge, and is interested in the possibility of going to rehab from here.  Has insurance through his father, and says his grandmother is able to meet co-pay.  Will go over possibilities later today.
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
## 2011-10-02 NOTE — Progress Notes (Signed)
10/02/2011         Time: 1500      Group Topic/Focus: The focus of this group is on enhancing patients' problem solving skills, which involves identifying the problem, brainstorming solutions and choosing and trying a solution.  Participation Level: Active  Participation Quality: Attentive  Affect: Blunted  Cognitive: Oriented   Additional Comments: None.    Gwendolyn Nishi 10/02/2011 3:41 PM

## 2011-10-02 NOTE — Tx Team (Signed)
Initial Interdisciplinary Treatment Plan  PATIENT STRENGTHS: (choose at least two) Ability for insight Active sense of humor Average or above average intelligence Communication skills Motivation for treatment/growth Physical Health Religious Affiliation Supportive family/friends Work skills  PATIENT STRESSORS: Legal issue Occupational concerns Substance abuse   PROBLEM LIST: Problem List/Patient Goals Date to be addressed Date deferred Reason deferred Estimated date of resolution  "Quit having to rely on drugs" 10/02/11     "Change the people I hang out with" 10/02/11     Depression 10/02/11     Increased risk for suicide 10/02/11     Substance Abuse 10/02/11                              DISCHARGE CRITERIA:  Ability to meet basic life and health needs Adequate post-discharge living arrangements Improved stabilization in mood, thinking, and/or behavior Medical problems require only outpatient monitoring Motivation to continue treatment in a less acute level of care Need for constant or close observation no longer present Reduction of life-threatening or endangering symptoms to within safe limits Safe-care adequate arrangements made Verbal commitment to aftercare and medication compliance Withdrawal symptoms are absent or subacute and managed without 24-hour nursing intervention  PRELIMINARY DISCHARGE PLAN: Attend aftercare/continuing care group Attend 12-step recovery group Outpatient therapy Participate in family therapy Placement in alternative living arrangements Return to previous living arrangement  PATIENT/FAMIILY INVOLVEMENT: This treatment plan has been presented to and reviewed with the patient, Carlos Lester, and/or family member.  The patient and family have been given the opportunity to ask questions and make suggestions.  Carlos Lester [facility] 10/02/2011, 12:37 AM

## 2011-10-02 NOTE — Progress Notes (Signed)
[facility] Group Notes:  (Counselor/Nursing/MHT/Case Management/Adjunct)  10/02/2011 2:26 PM  Type of Therapy:  Group Therapy  Participation Level:  Minimal  Participation Quality:  Attentive  Affect:  Depressed  Cognitive:  Alert and Oriented  Insight:  None shared  Engagement in Group:  Limited  Engagement in Therapy:  Limited  Modes of Intervention:  Education, Socialization and Support  Summary of Progress/Problems:Patient attended group presentation by Interior and spatial designer of  [facility] [facility] [employer] [facility] [place] ([employer]). Carlos Lester was somewhat attentive during session although he seemed to be experiencing some symptoms of withdrawal.      Clide Dales 10/02/2011, 2:26 PM

## 2011-10-02 NOTE — [facility] Suicide Risk Assessment (Signed)
Suicide Risk Assessment  Admission Assessment     See PAA/H&P completed by this writer upon admission.  Kuroski-Mazzei, Caffie Sotto 10/02/2011, 2:00 PM

## 2011-10-02 NOTE — Treatment Plan (Signed)
Interdisciplinary Treatment Plan Update (Adult)  Date: 10/02/2011  Time Reviewed: 10:39 AM   Progress in Treatment: Attending groups: Yes Participating in groups: Yes Taking medication as prescribed: Yes Tolerating medication: Yes   Family/Significant other contact made: No  Patient understands diagnosis:  Yes  As evidenced by asking help with detox from benzos Discussing patient identified problems/goals with staff:  Yes  See below Medical problems stabilized or resolved:  Yes Denies suicidal/homicidal ideation: Yes  In tx team Issues/concerns per patient self-inventory:  Yes  Poor sleep, Depression, hopelessness and anxiety all 8's.  C/O withdrawal symptoms Other:  New problem(s) identified: N/A  Reason for Continuation of Hospitalization: Anxiety Depression Medication stabilization Withdrawal symptoms  Interventions implemented related to continuation of hospitalization: Librium taper, Celexa trial, encourage group attendance and participation  Additional comments:  Estimated length of stay: 2-3 days  Discharge Plan:  See below  New goal(s): N/A  Review of initial/current patient goals per problem list:   1.  Goal(s): Eliminate SI  Met:  Yes  Target date:8/27  As evidenced [id number] report in tx team  2.  Goal (s): safely detox from benzos  Met:  No  Target date:8/30  As evidenced EA:[id number] vitals, no withdrawal symptoms  3.  Goal(s):Stabilize mood  Met:  No  Target date:8/30  As evidenced [id number] will rate his depression and anxiety at 4's or less  4.  Goal(s): Identify comprehensive sobriety plan  Met:  No  Target date:8/28  As evidenced [id number] is deciding between IOP and rehab  Attendees: Patient: Carlos Lester  10/02/2011 10:39 AM  Family:     Physician:  Lupe Carney 10/02/2011 10:39 AM   Nursing: Robbie Louis   10/02/2011 10:39 AM   Case Manager:  Richelle Ito, LCSW 10/02/2011 10:39 AM   Counselor:  Ronda Fairly, LCSWA  10/02/2011 10:39 AM   Other:     Other:     Other:     Other:      Scribe for Treatment Team:   Ida Rogue, 10/02/2011 10:39 AM

## 2011-10-03 MED ORDER — NAPROXEN 500 MG PO TABS
500.0000 mg | ORAL_TABLET | Freq: Two times a day (BID) | ORAL | Status: DC
  Administered 2011-10-03 – 2011-10-06 (×6): 500 mg via ORAL
  Filled 2011-10-03 (×10): qty 1

## 2011-10-03 MED ORDER — NAPROXEN SODIUM 550 MG PO TABS: 550.0000 mg | ORAL_TABLET | Freq: Two times a day (BID) | ORAL | Status: DC

## 2011-10-03 NOTE — Progress Notes (Addendum)
Patient ID: Carlos Lester, male   DOB: Dec 02, 1990, 21 y.o.   MRN: [id number] D: Pt is awake and active on the unit this PM. Pt denies SI/HI and A/V hallucinations. Pt is participating in the milieu and is cooperative with staff. Pt rates their depression at 8 and hopelessness at 8. Pt's most recent COWS score was 4. Pt mood is restless and his mood is anxious. Pt is somewhat needy but also polite.   A: Writer offered self, utilized therapeutic communication and administered medication per MD orders. Writer also encouraged pt to discuss feelings with staff and attend groups. Pt plan is to attend [facility] after discharge and maintain his sobriety. Pt is requesting Drysol deoderant for excessive axilla diaphoresis.   R: Pt is attending groups and tolerating medications well. Writer will continue to monitor. 15 minute checks are ongoing for safety. Writer passed on pt request.

## 2011-10-03 NOTE — Progress Notes (Deleted)
la 

## 2011-10-03 NOTE — Progress Notes (Signed)
[facility] Group Notes:  (Counselor/Nursing/MHT/Case Management/Adjunct)   Type of Therapy:  Group Therapy from 1:15 to 2:30 PM  Participation Level: Active  Participation Quality:  Appropriate, Attentive and Sharing  Affect:  Appropriate although somewhat depressed  Cognitive:  Appropriate  Insight:  Good  Engagement in Group:  Good  Engagement in Therapy:  Good  Modes of Intervention:  Clarification, Socialization and Support  Summary of Progress/Problems: The focus of this group session was to process how we deal with difficult emotions and share with others the patterns that play out when we are reacting to the emotion verses the situation.  Carlos Lester shared that one of the most difficult emotions he deals with is same for many others, anger. "I find I am a lot more prone to anger when I am using or coming off drugs." Patient agreed that thinking about our problems often tends to make them seem bigger.  Carlos Lester shared good feeling about his discharge to local treatment center.    Carlos Lester 10/03/2011, 7:31 PM

## 2011-10-03 NOTE — Discharge Planning (Signed)
Pt presented with stable mood and reported that he "feels better than yesterday." He changed his mind about going to [facility] and stated an interest in [facility] that his uncle had previously suggested to him. Pt signed information release form; information requested Faxed over; pending approval for admission to [facility]. I targeted Saturday as transfer date.

## 2011-10-03 NOTE — Progress Notes (Signed)
Psychoeducational Group Note  Date:  10/03/2011 Time:  1100  Group Topic/Focus:  Personal Choices and Values:   The focus of this group is to help patients assess and explore the importance of values in their lives, how their values affect their decisions, how they express their values and what opposes their expression.  Participation Level:  Active  Participation Quality:  Appropriate and Attentive  Affect:  Appropriate  Cognitive:  Appropriate  Insight:  Good  Engagement in Group:  Good  Additional Comments:  Pt participated in personal choices and values group. Pt stated what were negative and positive values and choices that have been made in past and present. Pt was given a worksheet on identifying values, where pt circled values on sheet that were important to pt. Pt also completed a worksheet on choosing a value-orientated life, which explains what values would like to be improved and how it could be improved. Pt was cooperative and appropriate during group and shared ideas and thoughts.   Karleen Hampshire Brittini 10/03/2011, 1:29 PM

## 2011-10-03 NOTE — Progress Notes (Signed)
Psychoeducational Group Note  Date:  10/03/2011 Time: 2000 Group Topic/Focus: AA Group .  Participation Level:  Active  Participation Quality:  Appropriate  Affect:  Appropriate  Cognitive:  Appropriate  Insight:  Good  Engagement in Group:  Good  Additional Comments:    Carlos Lester Patience 10/03/2011, 10:28 PM

## 2011-10-03 NOTE — Progress Notes (Signed)
[facility] Adult Inpatient Family/Significant Other Suicide Prevention Education  Suicide Prevention Education:  Education Completed; Laurene Footman at [phone] has been identified by the patient as the family member/significant other with whom the patient will be residing, and identified as the person(s) who will aid the patient in the event of a mental health crisis (suicidal ideations/suicide attempt).  With written consent from the patient, the family member/significant other has been provided the following suicide prevention education, prior to the and/or following the discharge of the patient.  The suicide prevention education provided includes the following:  Suicide risk factors  Suicide prevention and interventions  National Suicide Hotline telephone number  [facility] assessment telephone number  [facility] Emergency Assistance 911  [facility] and/or Residential Mobile Crisis Unit telephone number  Request made of family/significant other to:  Remove weapons (e.g., guns, rifles, knives), all items previously/currently identified as safety concern.    Remove drugs/medications (over-the-counter, prescriptions, illicit drugs), all items previously/currently identified as a safety concern.  The family member/significant other verbalizes understanding of the suicide prevention education information provided.  The family member/significant other agrees to remove the items of safety concern listed above. Ms. Dareen Piano was unable to take number for Mobile Crisis Management thus writer leaving a card for her with son as she will visit Thursday.   Clide Dales 10/03/2011, 1:11 PM

## 2011-10-03 NOTE — Progress Notes (Signed)
[facility] In Patient Progress Note  10/04/2011 10:51 PM AHIJAH DEVERY 03/31/1990 [id number] Diagnosis:  Axis I:   ADL's:  Intact  Sleep:  Yes,  AEB:  Appetite: good  Suicidal Ideation: No suicidal ideation, no plan, no intent, no means.  Homicidal Ideation:  No homicidal ideation, no plan, no intent, no means.  Subjective: patient states that he is feeling good and has no withdrawal symptoms. He is asking about discharge today.  He also reports that he is not taking the clonidine.  oral temperature is 98.2 F (36.8 C). His blood pressure is 113/68 and his pulse is 77. His respiration is 20.  Objective: Speech is clear and goal directed. No withdrawal symptoms reported. He denies SI/HI, no AVH. Mood is euthymic affect is congruent.   Mental Status: alert and active in unit milieu Orientation: x 3 General Appearance : Behavior:  Casual Eye Contact:  Good Motor Behavior:  Normal few tremors Speech:  Normal Level of Consciousness:  Alert Mood:  Euthymic Affect:  Appropriate Anxiety Level:  Minimal Thought Process:  Coherent Thought Content:  WNL Perception:  Normal Judgment:  Fair Insight:  Present Cognition:  At least average Sleep:  Number of Hours: 4.75   Lab Results: No results found for this or any previous visit (from the past 48 hour(s)). Labs are reviewed. Physical Findings: AIMS: Facial and Oral Movements Muscles of Facial Expression: None, normal Lips and Perioral Area: None, normal Jaw: None, normal Tongue: None, normal,Extremity Movements Upper (arms, wrists, hands, fingers): None, normal Lower (legs, knees, ankles, toes): None, normal, Trunk Movements Neck, shoulders, hips: None, normal, Overall Severity Severity of abnormal movements (highest score from questions above): None, normal Incapacitation due to abnormal movements: None, normal Patient's awareness of abnormal movements (rate only patient's report): No Awareness, Dental Status Current problems  with teeth and/or dentures?: No Does patient usually wear dentures?: No  CIWA:  CIWA-Ar Total: 1  COWS:  COWS Total Score: 4    Medication: . chlordiazePOXIDE  25 mg Oral BH-qamhs   Followed by  . chlordiazePOXIDE  25 mg Oral Daily  . citalopram  20 mg Oral Daily  . multivitamin with minerals  1 tablet Oral Daily  . naproxen  500 mg Oral BID WC  . nicotine  21 mg Transdermal Q0600  . QUEtiapine  25 mg Oral TID  . QUEtiapine  50 mg Oral QHS  . thiamine  100 mg Oral Daily  . traZODone  50 mg Oral QHS,MR X 1   Treatment Plan Summary: 1. Patient will continue today and if no further withdrawal symptoms in the next 24 hours he will be able to D/C home on Friday.  Plan: 1. Continue current plan of care as is and monitor WD symptoms. 2.Added Seroquel for sleep and anxiety. Rona Ravens. Grisell Bissette PAC 10/04/2011, 10:51 PM

## 2011-10-03 NOTE — Progress Notes (Signed)
Pt has been up for most groups today.  He reported sleep as fair appetite improving and energy level low.  He rated all his depression hopelessness and anxiety a 8 on his self-inventory.  He is still c/o chilling cravings agitation and body aches.  He denied any S/H ideations or A/ V hallucinations.  He plans to go to [facility] at discharge.  He has had two prn meds today.  Vistaril at 1101 for anxiety and tylenol for headache at 1209 were both given.  He has no new orders thus far.  Marland Kitchen

## 2011-10-04 DIAGNOSIS — F1910 Other psychoactive substance abuse, uncomplicated: Secondary | ICD-10-CM | POA: Diagnosis present

## 2011-10-04 MED ORDER — QUETIAPINE FUMARATE 25 MG PO TABS
25.0000 mg | ORAL_TABLET | Freq: Three times a day (TID) | ORAL | Status: DC
  Administered 2011-10-04 – 2011-10-06 (×6): 25 mg via ORAL
  Filled 2011-10-04 (×12): qty 1

## 2011-10-04 MED ORDER — QUETIAPINE FUMARATE 50 MG PO TABS
50.0000 mg | ORAL_TABLET | Freq: Every day | ORAL | Status: DC
  Administered 2011-10-04 – 2011-10-05 (×2): 50 mg via ORAL
  Filled 2011-10-04 (×4): qty 1

## 2011-10-04 MED ORDER — HYDROXYZINE HCL 50 MG PO TABS
50.0000 mg | ORAL_TABLET | ORAL | Status: DC | PRN
  Administered 2011-10-04 – 2011-10-06 (×8): 50 mg via ORAL

## 2011-10-04 NOTE — Progress Notes (Signed)
Psychoeducational Group Note  Date:  10/04/2011 Time:  1100  Group Topic/Focus:  Rediscovering Joy:   The focus of this group is to explore various ways to relieve stress in a positive manner.  Participation Level:  Active  Participation Quality:  Appropriate, Attentive and Sharing  Affect:  Appropriate  Cognitive:  Appropriate  Insight:  Good  Engagement in Group:  Good  Additional Comments:   Pt participated in rediscovering joy. Pt discussed ways that humor and laughter can have positive benefits for the mind and body. Pt also talked about ways that humor and joy can be apart of everyday life. Pt stated ways to receive joy. Activity was performed during group, on the five senses. Pt was asked within the five sense name one thing that would give joy. Example would be for hearing, listening to the birds chirp. Pt was sharing, cooperative during group.   Karleen Hampshire Brittini 10/04/2011, 1:33 PM

## 2011-10-04 NOTE — Progress Notes (Signed)
D:  Pt about on the unit today.  He was a little agitated over one of his peers this am but went to his room and took a shower and was able to settle down.  He has been attending groups.  He rates his depression as a 7/10 and his hopelessness as a 6/10 .  He denies SI/HI.  He says he has some tremors and some sweating and anxiety and agitation.  He says he hopes to go to [facility] and then [facility] upon discharge.  A;  Offered support and encouragement.  Given meds as prescribed and prn for anxiety.  R:  Receptive to staff.  Remains on q 15 minute checks for safety.  Will continue to monitor.

## 2011-10-04 NOTE — Progress Notes (Signed)
Pt requested to see RN after lunch today... Said he started shaking real bad and he was having trouble catching his breath.  "It just came over me".  RN talked with pt and encouraged him to take some deep, slow breaths and he was able to work through his panic.  Pt says he hopes to be discharged on something that will help his anxiety/panic He says he may be going to [facility].  He says before he came in here he was always self medicating with drugs.  Will continue to follow.  Marland Kitchen

## 2011-10-04 NOTE — Progress Notes (Signed)
D: Pt came to the medication window at the beginning of this shift. He requested for his repeat dose of trazodone and Gatorade; also  requested for Vistaril but it was too early for him to receive another dose of Vistaril.Marland Kitchen His mood and affect  Seemed appropriate. Patient received the trazodone; went to his room and returned about 25 minutes after that  with complaint of  panic attack.   A: Best boy. He ordered Vistaril 50 mg every four hour as needed for anxiety. R: Pt received the medication and returned to his room.

## 2011-10-04 NOTE — Progress Notes (Signed)
10/04/2011         Time: 1500      Group Topic/Focus: The focus of this group is on enhancing the patient's understanding of leisure, barriers to leisure, and the importance of engaging in positive leisure activities upon discharge for improved total health.  Participation Level: Minimal  Participation Quality: Monopolizing and Intrusive  Affect: Labile  Cognitive: Oriented   Additional Comments: Patient requiring constant redirection for cursing, talking over others, inappropriate conversations. Patient reported he couldn't control his behavior because his nurse wouldn't give him any medication- firm limits were set.   Elysse Polidore 10/04/2011 3:40 PM

## 2011-10-04 NOTE — Discharge Planning (Signed)
I spoke with Carlos Lester's atty who stated that court date will likely be continued due to transfer from here to rehab.  [name] expressed relief at this news.

## 2011-10-04 NOTE — Progress Notes (Signed)
D:Pt in day room at the beginning of this shift. He reported that he had a decent day but; "depressed now". Pt stated that his grandma and uncle visited and he became depressed after they left. He is somewhat intrusive and med seeking. He denied SI/HI; but endorsed feeling shaky and dehydrated. He shook his hands and said he had tremors. Patient came to the medication window after that and started saying course words. He requested for Vistaril and Librium. A: Writer Checked pt medication profile; he could only receive Librium. Writer notified patient that it's his last PRN dose of Librium and that it's too early for Vistaril. Limit set as to use words on the unit. Encouraged to attend karaoke. R: Pt received his medications without difficulty. Receptive to corrections; and apologized.

## 2011-10-04 NOTE — Progress Notes (Signed)
[facility] Group Notes:  (Counselor/Nursing/MHT/Case Management/Adjunct)  10/04/2011 3:25 PM  Type of Therapy:  Group Therapy  Participation Level:  Active  Participation Quality:  Appropriate and Attentive  Affect:  Appropriate  Cognitive:  Appropriate  Insight:  Good  Engagement in Group:  Good  Engagement in Therapy:  Good  Modes of Intervention:  Problem-solving, Support and exploration  Summary of Progress/Problems:Pt attended group therapy to process feeling about finding balance in one's life. Pt explored what balance means and looks like in their individual lives, what imbalance looks like and how to adjustment from life of addiction to a life of recovery. Carlos Lester, LPCA     Carlos Lester L 10/04/2011, 3:25 PM

## 2011-10-05 DIAGNOSIS — F1320 Sedative, hypnotic or anxiolytic dependence, uncomplicated: Secondary | ICD-10-CM

## 2011-10-05 DIAGNOSIS — F329 Major depressive disorder, single episode, unspecified: Secondary | ICD-10-CM

## 2011-10-05 DIAGNOSIS — F1020 Alcohol dependence, uncomplicated: Secondary | ICD-10-CM

## 2011-10-05 DIAGNOSIS — F909 Attention-deficit hyperactivity disorder, unspecified type: Secondary | ICD-10-CM

## 2011-10-05 DIAGNOSIS — F1994 Other psychoactive substance use, unspecified with psychoactive substance-induced mood disorder: Principal | ICD-10-CM

## 2011-10-05 MED ORDER — THIAMINE HCL 100 MG PO TABS: 100.0000 mg | ORAL_TABLET | Freq: Every day | ORAL | Status: AC

## 2011-10-05 MED ORDER — CITALOPRAM HYDROBROMIDE 20 MG PO TABS: 20.0000 mg | ORAL_TABLET | Freq: Every day | ORAL | Status: DC

## 2011-10-05 MED ORDER — TRAZODONE HCL 50 MG PO TABS: 50.0000 mg | ORAL_TABLET | Freq: Every evening | ORAL | Status: DC | PRN

## 2011-10-05 MED ORDER — NAPROXEN 500 MG PO TABS: 500.0000 mg | ORAL_TABLET | Freq: Two times a day (BID) | ORAL | Status: DC

## 2011-10-05 MED ORDER — HYDROXYZINE HCL 50 MG PO TABS: 50.0000 mg | ORAL_TABLET | ORAL | Status: AC | PRN

## 2011-10-05 MED ORDER — QUETIAPINE FUMARATE 25 MG PO TABS: ORAL_TABLET | ORAL | Status: DC

## 2011-10-05 NOTE — Progress Notes (Signed)
[facility] Case Management Discharge Plan:  Will you be returning to the same living situation after discharge: No. At discharge, do you have transportation home?:Yes,  uncle is coming to pick up and transport home to get clothes and will transport to [facility] Do you have the ability to pay for your medications:Yes,  [employer] insurance  Interagency Information:     Release of information consent forms completed and in the chart;  Patient's signature needed at discharge.  Patient to Follow up at:  Follow-up Information    Follow up with [facility] on 10/06/2011. (Appointment time at 2:00pm on Saturday, 8/21 )    Contact information:   [address] [place], [place] [address] [phone]         Patient denies SI/HI:   Yes,  yes    Safety Planning and Suicide Prevention discussed:  Yes,  yes  Barrier to discharge identified:No.  Summary and Recommendations:   Smart, Ledell Peoples 10/05/2011, 2:09 PM

## 2011-10-05 NOTE — Discharge Planning (Signed)
Pt attending morning group session. Pt stated that he was feeling better today and slept well last night but woke up sweaty. Pt is "nervous but excited" about attending [facility] tomorrow at 2pm. After session, pt and CM Intern called pt's uncle to make sure uncle agreed to take pt home after discharge to get clothes before admittance to [facility] at 2pm on 8/31. Uncle stated that he was fine with this and will come to pickup pt around 11am tomorrow morning. Aware there may be slight delay in discharge time. Pt wanted to make sure that he will continue to have access to current meds because they seem to be working well for him.

## 2011-10-05 NOTE — Progress Notes (Signed)
Patient ID: Carlos Lester, male   DOB: [birth year], 21 y.o.   MRN: [id number] D: Pt. began med-seeking at change of shift and became increasingly demanding and louder in his protestations of need for more medication, though all available meds had been given within the previous 1-2 hours.  Pt. is intrusive, with an attitude of entitlement and responds to explanations of medications schedules and his need to wait with a childlike temper, choosing finally to withdraw to his room, then sat in the hallway floor with a peer. At [facility] med pass, Pt. Refused to take medications from this Clinical research associate and another (male) nurse gave him his meds.  Pt. then sat in the hallway until he got up and went to bed. A: Pt. is mentally dependent on substances to get through his day, evening and any stressor.  Insight is poor and Pt. does not seem to be ready to engage in his recovery from addiction. R: Pt. Will continue to monitored for his med-seeking behaviors and to redirect his focus on his mental and physical self-care with the use of substances and ETOH.
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
## 2011-10-05 NOTE — Progress Notes (Signed)
Psychoeducational Group Note  Date:  10/05/2011 Time:  2000  Group Topic/Focus:  AA group  Participation Level: Did Not Attend  Participation Quality:  Not Applicable  Affect:  Not Applicable  Cognitive:  Not Applicable  Insight:  Not Applicable  Engagement in Group: Not Applicable  Additional Comments:    Flonnie Hailstone 10/05/2011, 9:50 PM
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
## 2011-10-05 NOTE — Progress Notes (Signed)
Pt. Waiting in line to go to lunch. Stated he feels nervous at times but does not want to take seroquel anymore as it makes him to sleepy. Pt is requesting to be put on libruim. Pt. Has good eye contact and contracts for safety. Speech is very rapid and pt does appear to have some hand tremors. No Si or HI and contracts for safety. Pt is cooperative.

## 2011-10-05 NOTE — Progress Notes (Addendum)
[facility] Group Notes:  (Counselor/Nursing/MHT/Case Management/Adjunct)  10/05/2011  2:30  PM  Type of Therapy: Group Therapy   Participation Level: Did not attend.        Marni Griffon C 10/05/2011  2:30 PM

## 2011-10-06 HISTORY — PX: OTHER SURGICAL HISTORY: SHX169

## 2011-10-06 LAB — COMPREHENSIVE METABOLIC PANEL
Albumin: 4.6 g/dL (ref 3.5–5.2)
BUN: 18 mg/dL (ref 6–23)
Chloride: 100 mEq/L (ref 96–112)
Creatinine, Ser: 1.06 mg/dL (ref 0.50–1.35)
GFR calc non Af Amer: >90 mL/min (ref >90)
Total Bilirubin: 0.4 mg/dL (ref 0.3–1.2)

## 2011-10-06 NOTE — Progress Notes (Signed)
[facility] Case Management Discharge Plan:  Will you be returning to the same living situation after discharge: No. At discharge, do you have transportation home?:Yes,   Do you have the ability to pay for your medications:Yes,    Interagency Information:     Release of information consent forms completed and in the chart;  Patient's signature needed at discharge.  Patient to Follow up at:  Follow-up Information    Follow up with [facility] on 10/06/2011. (Appointment time at 2:00pm on Saturday, 8/21 )    Contact information:   [address] [place], [place] [address] [phone]         Patient denies SI/HI:   Yes    Safety Planning and Suicide Prevention discussed:  Yes  Barrier to discharge identified: None  Summary and Recommendations: Pt's Uncle will transport him to [facility].  Complete inpatient treatment at [facility], followed by [facility] or other sober living facility.     Christen Butter 10/06/2011, 9:43 AM

## 2011-10-06 NOTE — Progress Notes (Signed)
[facility] Group Notes:  (Counselor/Nursing/MHT/Case Management/Adjunct)  10/06/2011 9:30 AM  Type of Therapy:  Discharge Planning  Participation Level:  Minimal  Participation Quality:  Attentive and Sharing  Affect:  Appropriate  Cognitive:  Alert and Oriented  Insight:  Limited  Engagement in Group:  Limited  Engagement in Therapy:  Limited  Modes of Intervention:  Clarification, Education, Limit-setting, Problem-solving and Support  Summary of Progress/Problems: Pt. attended and participated in aftercare planning group. Pt. accepted information on suicide prevention, warning signs to look for with suicide and crisis line numbers to use. The pt. agreed to call crisis line numbers if having warning signs or having thoughts of suicide. Pt. listed their current anxiety level as  3 due to going to [facility] today.  Pt stated his uncle will transport him there and he plans to go to an [facility] following.  Some progress noted.  Intervention Effective.      Marni Griffon C 10/06/2011, 9:30 AM

## 2011-10-06 NOTE — Progress Notes (Addendum)
   [facility] [address] [place], [place] [address]    10/06/2011  Patient:  Carlos Lester Date of Birth:  March 18, 1990 Date of Visit: 10/01/2011 11:54 PM  To Whom it May Concern:  This letter is in regards to [employer].  He has requested that I contact you to inform you of His stay with the [facility]. He was admitted to the Emergency Department on 10/06/2011 and to the hospital on 10/01/2011 11:54 PM and has a discharge date of 10/06/2011. Thank you for your consideration.  If you require additional information, please contact Rodney Cruise to obtain written authorization to release protected health information.  Federal Engineer, building services do not permit release of information without signed authorization. Carlos Lester may stop by the hospital to complete an Authorization to [employer].  Thank you for your cooperation in this regard.  If you have any questions or concerns, please don't hesitate to call.    Sincerely,     Curlene Labrum. Readling, MD

## 2011-10-10 NOTE — Progress Notes (Signed)
Patient Discharge Instructions:  After Visit Summary (AVS):   Faxed to:  10/10/2011 Psychiatric Admission Assessment Note:   Faxed to:  10/10/2011 Suicide Risk Assessment - Discharge Assessment:   Faxed to:  10/10/2011 Faxed/Sent to the Next Level Care provider:  10/10/2011  Faxed to [facility] @ [phone]  Heloise Purpura, Eduard Clos, 10/10/2011, 4:36 PM

## 2012-01-06 ENCOUNTER — Ambulatory Visit (INDEPENDENT_AMBULATORY_CARE_PROVIDER_SITE_OTHER): Payer: Managed Care, Other (non HMO) | Admitting: Family Medicine

## 2012-01-06 VITALS — BP 123/80 | HR 104 | Temp 97.8°F | Resp 18 | Ht 66.0 in | Wt 176.0 lb

## 2012-01-06 DIAGNOSIS — F329 Major depressive disorder, single episode, unspecified: Secondary | ICD-10-CM

## 2012-01-06 DIAGNOSIS — F1910 Other psychoactive substance abuse, uncomplicated: Secondary | ICD-10-CM

## 2012-01-06 DIAGNOSIS — R05 Cough: Secondary | ICD-10-CM

## 2012-01-06 DIAGNOSIS — J029 Acute pharyngitis, unspecified: Secondary | ICD-10-CM

## 2012-01-06 DIAGNOSIS — G4700 Insomnia, unspecified: Secondary | ICD-10-CM

## 2012-01-06 DIAGNOSIS — R61 Generalized hyperhidrosis: Secondary | ICD-10-CM

## 2012-01-06 LAB — POCT INFLUENZA A/B: Influenza B, POC: NEGATIVE

## 2012-01-06 LAB — POCT RAPID STREP A (OFFICE): Rapid Strep A Screen: NEGATIVE

## 2012-01-06 MED ORDER — TRAZODONE HCL 50 MG PO TABS: 75.0000 mg | ORAL_TABLET | Freq: Every day | ORAL | Status: DC

## 2012-01-06 MED ORDER — CITALOPRAM HYDROBROMIDE 20 MG PO TABS: 20.0000 mg | ORAL_TABLET | Freq: Every day | ORAL | Status: DC

## 2012-01-06 MED ORDER — AMOXICILLIN 500 MG PO CAPS: 500.0000 mg | ORAL_CAPSULE | Freq: Three times a day (TID) | ORAL | Status: DC

## 2012-01-06 NOTE — Progress Notes (Signed)
[address]   [place], [place]  [address]   [phone]  Subjective:    Patient ID: Carlos Lester, male    DOB: 1991/01/21, 21 y.o.   MRN: [id number]  HPIThis 21 y.o. male presents for evaluation multiple concerns:    1. Sore throat, fever.  Onset yesterday.  No fever;  no chills/sweats.  +HA.  +ST diffusely; +glands swollen.  No rhinorrhea; +nasal congestion.  No ear pain.  +coughing dry.  No SOB.  No v/d.  No rash.  No flu vaccine.  No medication other than Mucinex this morning.  Just started new job one week ago.    2.  Depression:  S/p Behavioral Health admission 09/2011 for polysubstance abuse/dependence (benzos, alcohol).  Went to [facility] on [facility] after [facility] admission.  S/p admission at  [facility] for one month in September 2013.  Treatment 10/06/11-11/2011.  Called [facility] for refills five days ago and they recommended establishing with PCP.  Also recommended counselor after sobriety for six months.  Emotionally stable.  No SI/HI.  Working at [employer]; prep shows x 1 week;  new job.  Living at [facility] with 7 other people who are in recovery; can stay as long as wants; must pay rent.  No counselors at [facility].  Sober for 95 days; doing well.  3. Insomnia:  Taking Trazodone 50mg  1.5 tablets qhs.  Must take medication or mind races at night.  Ran out of medication two days ago.  4.  Excessive Sweating: previously prescribed oral sweating pill but would actually like Drysol topical instead.  Would prefer topical treatment instead of oral treatment.  Psychiatrist did not want to prescribe Drysol due to alcohol comtent in Drysol thus was prescribed oral sweating medication.  "Alcohol is really my thing; I'd rather have topical Drysol. I have used it before and it works well".    Review of Systems  Constitutional: Positive for fever, chills and fatigue. Negative for diaphoresis.  HENT: Positive for congestion and sore throat. Negative for ear  pain, rhinorrhea, mouth sores and sinus pressure.   Respiratory: Positive for cough. Negative for shortness of breath, wheezing and stridor.   Gastrointestinal: Negative for nausea, vomiting, abdominal pain and diarrhea.  Skin: Negative for rash.  Psychiatric/Behavioral: Positive for sleep disturbance and dysphoric mood. Negative for suicidal ideas, behavioral problems, self-injury and agitation. The patient is not nervous/anxious.     Past Medical History  Diagnosis Date  . Hypertension     Has been having headaches every night for a month.  . Shortness of breath     with panic attacks  . Anxiety   . Drug addiction     Past Surgical History  Procedure Date  . No past surgeries   . Incision / drainage hand / finger "had an infection"  . Admission 10/06/11    Behavioral Health Admission; polysubstance abuse/dependence (benzos, alcohol)    Prior to Admission medications   Medication Sig Start Date End Date Taking? Authorizing Provider  citalopram (CELEXA) 20 MG tablet Take 1 tablet (20 mg total) by mouth daily. For depression. 10/05/11 10/04/12 Yes Mike Craze, MD  traZODone (DESYREL) 50 MG tablet Take 75 mg by mouth at bedtime.   Yes Historical Provider, MD  naproxen (NAPROSYN) 500 MG tablet Take 1 tablet (500 mg total) by mouth 2 (two) times daily with a meal. For headaches 10/05/11 10/04/12  Mike Craze, MD  QUEtiapine (SEROQUEL) 25 MG tablet Take by  mouth 1/2 to 1 three times a day for anxiety and TWO at bedtime for insomnia 10/05/11   Mike Craze, MD  thiamine 100 MG tablet Take 1 tablet (100 mg total) by mouth daily. For nutritional supplementation. 10/05/11 10/04/12  Mike Craze, MD  traZODone (DESYREL) 50 MG tablet Take 1-2 tablets (50-100 mg total) by mouth at bedtime and may repeat dose one time if needed. For insomnia. 10/05/11 11/04/11  Mike Craze, MD    No Known Allergies  History   Social History  . Marital Status: Single    Spouse Name: N/A    Number of  Children: N/A  . Years of Education: N/A   Occupational History  . Not on file.   Social History Main Topics  . Smoking status: Current Every Day Smoker -- 1.0 packs/day for 6 years    Types: Cigarettes  . Smokeless tobacco: Not on file  . Alcohol Use: 3.6 oz/week    6 Cans of beer per week     Comment: 6 can beer weekly  . Drug Use: No     Comment: "Spice" (01/06/12 recovering addict)  . Sexually Active: Yes    Birth Control/ Protection: Condom   Other Topics Concern  . Not on file   Social History Narrative  . No narrative on file    Family History  Problem Relation Age of Onset  . Bipolar disorder Mother        Objective:   Physical Exam  Nursing note and vitals reviewed. Constitutional: He is oriented to person, place, and time. He appears well-developed and well-nourished. No distress.  HENT:  Head: Normocephalic and atraumatic.  Right Ear: External ear normal.  Left Ear: External ear normal.  Nose: Nose normal.  Mouth/Throat: Oropharynx is clear and moist. No oropharyngeal exudate.  Eyes: Conjunctivae normal and EOM are normal. Pupils are equal, round, and reactive to light.  Neck: Normal range of motion. Neck supple. No thyromegaly present.  Cardiovascular: Normal rate, regular rhythm and normal heart sounds.  Exam reveals no gallop and no friction rub.   No murmur heard. Pulmonary/Chest: Effort normal and breath sounds normal. He has no wheezes. He has no rales.  Lymphadenopathy:    He has cervical adenopathy.  Neurological: He is alert and oriented to person, place, and time. No cranial nerve deficit. Coordination normal.  Skin: Skin is warm and dry. No rash noted. He is not diaphoretic.  Psychiatric: He has a normal mood and affect. His behavior is normal. Judgment and thought content normal.   Results for orders placed in visit on 01/06/12  POCT INFLUENZA A/B      Component Value Range   Influenza A, POC Negative     Influenza B, POC Negative    POCT  RAPID STREP A (OFFICE)      Component Value Range   Rapid Strep A Screen Negative  Negative       Assessment & Plan:   1. Sore throat  POCT Influenza A/B, POCT rapid strep A, Culture, Group A Strep  2. Cough  POCT Influenza A/B  3. Depression    4. Insomnia    5. Polysubstance abuse    6. Excessive sweating      1.  URI/pharyngitis:  New. Send throat culture.  Empirically treat with Amoxicillin while awaiting throat culture results; if culture negative, can stop antibiotic. Continue Mucinex bid PRN.  Supportive care with rest, lbuprofen or Tylenol.  RTC for acute worsening. OOW note  for 01/07/12. 2.  Depression: stable.  With polysubstance abuse history s/p mental health admission in 09/2011.  Warrants continued psychiatry management due to two psychiatric comorbidities; pt expressed understanding. Will refer to psychiatry and for continued counseling.   3. Insomnia: controlled with Trazodone; refill provided but warrants management by psychiatry.  4.  Polysubstance abuse:  Improved; new to this provider.  Admission notes reviewed from 09/2011.  Warrants further management by psychiatry to address depression and polysubstance addiction. 5.  Excessive Sweating:  Uncontrolled; chronic issue for patient.  Not willing to prescribe Drysol due to high alcohol content.  Pt frustrated by lack of willingness to prescribe medication/Drysol.  Consider referral to dermatology in future to discuss other treatment options.

## 2012-01-06 NOTE — Patient Instructions (Addendum)
1. Sore throat  POCT Influenza A/B, POCT rapid strep A, Culture, Group A Strep, amoxicillin (AMOXIL) 500 MG capsule  2. Cough  POCT Influenza A/B  3. Depression    4. Insomnia    5. Polysubstance abuse    6. Excessive sweating

## 2012-01-07 ENCOUNTER — Telehealth: Payer: Self-pay

## 2012-01-08 LAB — CULTURE, GROUP A STREP: Organism ID, Bacteria: NORMAL

## 2012-01-12 NOTE — Progress Notes (Signed)
Reviewed and agree.

## 2012-05-12 ENCOUNTER — Encounter (HOSPITAL_COMMUNITY): Payer: Self-pay | Admitting: *Deleted

## 2012-05-12 ENCOUNTER — Emergency Department (HOSPITAL_COMMUNITY)
Admission: EM | Admit: 2012-05-12 | Discharge: 2012-05-12 | Disposition: A | Discharge status: Home / Self Care | Payer: Managed Care, Other (non HMO) | Source: Home / Self Care | Attending: Emergency Medicine | Admitting: Emergency Medicine

## 2012-05-12 DIAGNOSIS — R05 Cough: Secondary | ICD-10-CM

## 2012-05-12 DIAGNOSIS — R1110 Vomiting, unspecified: Secondary | ICD-10-CM

## 2012-05-12 MED ORDER — OMEPRAZOLE 40 MG PO CPDR: 40.0000 mg | DELAYED_RELEASE_CAPSULE | Freq: Every day | ORAL | Status: DC

## 2012-05-12 MED ORDER — BENZONATATE 100 MG PO CAPS: 100.0000 mg | ORAL_CAPSULE | Freq: Three times a day (TID) | ORAL | Status: DC | PRN

## 2012-05-12 MED ORDER — FEXOFENADINE HCL 60 MG PO TABS: 60.0000 mg | ORAL_TABLET | Freq: Every day | ORAL | Status: DC

## 2012-05-12 MED ORDER — ONDANSETRON HCL 4 MG PO TABS: 4.0000 mg | ORAL_TABLET | Freq: Three times a day (TID) | ORAL | Status: DC | PRN

## 2012-05-12 NOTE — ED Provider Notes (Addendum)
History     CSN: [id number]  Arrival date & time 05/12/12  1010   First MD Initiated Contact with Patient 05/12/12 1020      Chief Complaint  Patient presents with  . Cough    (Consider location/radiation/quality/duration/timing/severity/associated sxs/prior treatment) HPI Comments: Patient presents this morning to urgent care complaining of a new lead develop cough but comes in spells and had made him vomit about 3 times this morning. Patient describes some congested and runny nose some isolated sneezing and mild wheezing. She went to work this morning and couldn't keep working as a result of his cough and his episodes of vomiting. Denies any fevers, shortness of breath or diarrheas. Describes some mild epigastric discomfort, after his vomiting episodes.  Patient is a 22 y.o. male presenting with cough. The history is provided by the patient.  Cough Cough characteristics:  Harsh, paroxysmal and vomit-inducing Severity:  Moderate Onset quality:  Sudden Duration:  4 hours Timing:  Intermittent Chronicity:  New Context: exposure to allergens   Relieved by:  Nothing Worsened by:  Nothing tried Ineffective treatments:  None tried Associated symptoms: headaches, rhinorrhea, sinus congestion and wheezing   Associated symptoms: no chest pain, no chills, no diaphoresis, no ear pain, no eye discharge, no fever, no rash, no shortness of breath and no weight loss   Risk factors: no chemical exposure, no recent infection and no recent travel     Past Medical History  Diagnosis Date  . Hypertension     Has been having headaches every night for a month.  . Shortness of breath     with panic attacks  . Anxiety   . Drug addiction     Past Surgical History  Procedure Laterality Date  . No past surgeries    . Incision / drainage hand / finger  "had an infection"  . Admission  10/06/11    Behavioral Health Admission; polysubstance abuse/dependence (benzos, alcohol)    Family History   Problem Relation Age of Onset  . Bipolar disorder Mother     History  Substance Use Topics  . Smoking status: Current Every Day Smoker -- 1.00 packs/day for 6 years    Types: Cigarettes  . Smokeless tobacco: Not on file  . Alcohol Use: 3.6 oz/week    6 Cans of beer per week     Comment: 6 can beer weekly      Review of Systems  Constitutional: Negative for fever, chills, weight loss, diaphoresis, activity change and fatigue.  HENT: Positive for congestion and rhinorrhea. Negative for ear pain, neck pain and neck stiffness.   Eyes: Negative for discharge.  Respiratory: Positive for cough and wheezing. Negative for shortness of breath.   Cardiovascular: Negative for chest pain.  Gastrointestinal: Positive for vomiting and abdominal pain. Negative for nausea, diarrhea, blood in stool and anal bleeding.  Genitourinary: Negative for dysuria.  Skin: Negative for rash.  Allergic/Immunologic: Positive for environmental allergies. Negative for immunocompromised state.  Neurological: Positive for headaches. Negative for dizziness.    Allergies  Review of patient's allergies indicates no known allergies.  Home Medications   Current Outpatient Rx  Name  Route  Sig  Dispense  Refill  . benzonatate (TESSALON) 100 MG capsule   Oral   Take 1 capsule (100 mg total) by mouth 3 (three) times daily as needed for cough.   15 capsule   0   . citalopram (CELEXA) 20 MG tablet   Oral   Take 1 tablet (20 mg  total) by mouth daily. For depression.   30 tablet   3   . fexofenadine (ALLEGRA) 60 MG tablet   Oral   Take 1 tablet (60 mg total) by mouth daily.   14 tablet   0   . omeprazole (PRILOSEC) 40 MG capsule   Oral   Take 1 capsule (40 mg total) by mouth daily.   10 capsule   0   . ondansetron (ZOFRAN) 4 MG tablet   Oral   Take 1 tablet (4 mg total) by mouth every 8 (eight) hours as needed for nausea.   10 tablet   0   . QUEtiapine (SEROQUEL) 25 MG tablet      Take by mouth  1/2 to 1 three times a day for anxiety and TWO at bedtime for insomnia   150 tablet   0   . thiamine 100 MG tablet   Oral   Take 1 tablet (100 mg total) by mouth daily. For nutritional supplementation.   30 tablet   0   . EXPIRED: traZODone (DESYREL) 50 MG tablet   Oral   Take 1-2 tablets (50-100 mg total) by mouth at bedtime and may repeat dose one time if needed. For insomnia.   60 tablet   0   . traZODone (DESYREL) 50 MG tablet   Oral   Take 1.5 tablets (75 mg total) by mouth at bedtime.   45 tablet   5     BP 126/70  Pulse 72  Temp(Src) 98.1 F (36.7 C) (Oral)  Resp 18  SpO2 99%  Physical Exam  Nursing note and vitals reviewed. Constitutional: Vital signs are normal. He appears well-developed and well-nourished.  Non-toxic appearance. He does not have a sickly appearance. He does not appear ill. No distress.  HENT:  Head: Normocephalic.  Right Ear: Tympanic membrane normal.  Left Ear: Tympanic membrane normal.  Mouth/Throat: Uvula is midline and mucous membranes are normal. No oropharyngeal exudate, posterior oropharyngeal edema or tonsillar abscesses.  Eyes: Conjunctivae are normal. Pupils are equal, round, and reactive to light.  Cardiovascular: Normal rate.  Exam reveals no gallop and no friction rub.   No murmur heard. Pulmonary/Chest: Effort normal and breath sounds normal.  Abdominal: Soft. He exhibits no distension and no mass. There is no tenderness. There is no rebound and no guarding.  Neurological: He is alert.  Skin: No rash noted. No erythema.    ED Course  Procedures (including critical care time)  Labs Reviewed - No data to display No results found.   1. Cough   2. Post-tussive emesis       MDM  Cough with posttussive emesis. Patient looks comfortable in no respiratory distress with a normal abdominal exam. Afebrile. Most likely allergenic induce cough. Patient has been prescribed Tessalon Perles, Allegra, Prilosec and Zofran. Patient  is requesting a work note for the next 2 days. A work note has been admitted for today.   Post-discharge- approached by Nurse as patient continued demanding a work-note for  Tomorrow- I explained to patient that if he was not feeling any better tomorrow to let us know, or to return for a second exam. Patient was visibly upset, and using profanity and refused prescriptions and provide a work note.      Jimmie Molly, MD 05/12/12 1107  Jimmie Molly, MD 05/12/12 [phone]

## 2012-05-12 NOTE — ED Notes (Signed)
Pt  Reports  He  Became  Ill  This am    He  Reports  A    Cough   That   Developed      As  Well  As  Vomiting  X  3  Today  He  Reports a  Metallic  Taste  In  Mouth     -  He  Is  Sitting  Upright  On  Exam  Table   Table  Speaking in  Complete  sentances     He  Is  Wearing a  Mask

## 2012-05-12 NOTE — ED Notes (Signed)
Pt  Returned  requsting  Work  Note  And  Plan of  Care   Instruction  Brad  chrismon  Spoke  With pt      And  Pt accepted  Documents

## 2012-05-12 NOTE — ED Notes (Signed)
Pt  Requests  An  Extension of   His  Work  Note      For  tommorow  As  Well  As  Today     Dr  Ladon Applebaum  Notified       If  Pt   Still  Having  Symptoms  He  Needs  To be    Rechecked  -  Will  Not  Extend  Note     Pt  Advised      -  Pt      Insists  On  Speaking  To  Dr  Ladon Applebaum -  Dr  Ladon Applebaum  Returned  And  Spoke  To pt     Plan of  Care  Reviewed  And   No  Extension of  Work  Note   Explained by  Dr  Ladon Applebaum to pt       -  Pt   Refused the note        And   Refused  His  Discharge       Instructions   Pt  Used  Profanity   And  Left   With a  Brisk pace

## 2013-05-05 ENCOUNTER — Emergency Department (HOSPITAL_COMMUNITY)
Admission: EM | Admit: 2013-05-05 | Discharge: 2013-05-05 | Discharge status: Absent without leave | Payer: Self-pay | Source: Home / Self Care

## 2013-05-12 ENCOUNTER — Ambulatory Visit: Payer: Self-pay | Admitting: Emergency Medicine

## 2013-05-12 VITALS — BP 133/71 | HR 86 | Temp 97.8°F | Resp 16 | Ht 65.5 in | Wt 188.2 lb

## 2013-05-12 DIAGNOSIS — J209 Acute bronchitis, unspecified: Secondary | ICD-10-CM

## 2013-05-12 DIAGNOSIS — R05 Cough: Secondary | ICD-10-CM

## 2013-05-12 DIAGNOSIS — R059 Cough, unspecified: Secondary | ICD-10-CM

## 2013-05-12 MED ORDER — BENZONATATE 100 MG PO CAPS: 200.0000 mg | ORAL_CAPSULE | Freq: Three times a day (TID) | ORAL | Status: DC | PRN

## 2013-05-12 MED ORDER — AZITHROMYCIN 250 MG PO TABS
ORAL_TABLET | ORAL | Status: DC
Start: 2013-05-12 — End: 2015-10-07

## 2013-05-12 NOTE — Progress Notes (Signed)
   Subjective:    Patient ID: Carlos CruiseJoshua A Vollrath, male    DOB: 04/24/1990, 24 y.o.   MRN: [id number]  HPI 23 yo with complaint of cough and body aches for almost 3 weeks.  Subjective fever and chills.  Difficulty at night secondary to cough.  Positive post-tussive emesis.  No sore throat.  Mild runny nose and nasal congestion.  PPMH:  Substance abuse  SH:  Smoker   Review of Systems  Constitutional: Negative for fever and chills.  HENT: Positive for congestion and rhinorrhea. Negative for postnasal drip, sinus pressure and trouble swallowing.   Respiratory: Positive for cough. Negative for choking, chest tightness and shortness of breath.   Cardiovascular: Negative for chest pain and palpitations.  Gastrointestinal: Negative for nausea, diarrhea and constipation.  Musculoskeletal: Positive for myalgias.  Neurological: Negative for headaches.       Objective:   Physical Exam Blood pressure 133/71, pulse 86, temperature 97.8 F (36.6 C), temperature source Oral, resp. rate 16, height 5' 5.5" (1.664 m), weight 188 lb 3.2 oz (85.367 kg), SpO2 97.00%. Body mass index is 30.83 kg/(m^2). Well-developed, well nourished male who is awake, alert and oriented, in NAD. HEENT: [place]/AT, PERRL, EOMI.  Sclera and conjunctiva are clear.  EAC are patent, TMs are normal in appearance. Nasal mucosa with mild edema. OP is clear. Neck: supple, non-tender, no lymphadenopathy, thyromegaly. Heart: RRR, no murmur Lungs: faint wheeze. Abdomen: normo-active bowel sounds, supple, non-tender, no mass or organomegaly. Extremities: no cyanosis, clubbing or edema. Skin: warm and dry without rash. Psychologic: good mood and appropriate affect, normal speech and behavior.        Assessment & Plan:  Bronchitis Start Z-pak and tesselon perles.

## 2015-09-25 ENCOUNTER — Encounter (HOSPITAL_COMMUNITY): Payer: Self-pay | Admitting: *Deleted

## 2015-09-25 ENCOUNTER — Ambulatory Visit (HOSPITAL_COMMUNITY)
Admission: EM | Admit: 2015-09-25 | Discharge: 2015-09-25 | Disposition: A | Discharge status: Home / Self Care | Payer: Self-pay | Attending: Internal Medicine | Admitting: Internal Medicine

## 2015-09-25 DIAGNOSIS — M5441 Lumbago with sciatica, right side: Secondary | ICD-10-CM

## 2015-09-25 DIAGNOSIS — K2970 Gastritis, unspecified, without bleeding: Secondary | ICD-10-CM

## 2015-09-25 LAB — POCT URINALYSIS DIP (DEVICE)
BILIRUBIN URINE: NEGATIVE
Glucose, UA: NEGATIVE mg/dL
HGB URINE DIPSTICK: NEGATIVE
Ketones, ur: NEGATIVE mg/dL
Leukocytes, UA: NEGATIVE
NITRITE: NEGATIVE
PH: 6 (ref 5.0–8.0)
Protein, ur: NEGATIVE mg/dL
Urobilinogen, UA: 0.2 mg/dL (ref 0.0–1.0)

## 2015-09-25 MED ORDER — CYCLOBENZAPRINE HCL 10 MG PO TABS: 10.0000 mg | ORAL_TABLET | Freq: Two times a day (BID) | ORAL | 0 refills | Status: DC | PRN

## 2015-09-25 MED ORDER — ONDANSETRON HCL 4 MG PO TABS: 4.0000 mg | ORAL_TABLET | Freq: Four times a day (QID) | ORAL | 0 refills | Status: DC

## 2015-09-25 MED ORDER — TRAMADOL HCL 50 MG PO TABS: 50.0000 mg | ORAL_TABLET | Freq: Four times a day (QID) | ORAL | 0 refills | Status: DC | PRN

## 2015-09-25 NOTE — ED Triage Notes (Signed)
C/O sudden onset right flank/low back pain radiating into right buttock when bending over at work 7 days ago.  Has continued with severe flank pain, now having dysuria and vomiting.  Able to keep down water, but no food x 3 days.  Has felt feverish and "clammy".  Unable to sleep due to pain.

## 2015-09-25 NOTE — ED Provider Notes (Signed)
CSN: [id number]     Arrival date & time 09/25/15  1814 History   First MD Initiated Contact with Patient 09/25/15 1842     Chief Complaint  Patient presents with  . Back Pain  . Dysuria   (Consider location/radiation/quality/duration/timing/severity/associated sxs/prior Treatment) HPI 25 year old male injured his back 7 days ago while working. He states that he has been using a great deal of ibuprofen and now has nausea and occasional vomiting. He states that he has pain that radiates from his low back into his buttock. He does not have any bowel or bladder dysfunction. Pain score at this time is 3. Past Medical History:  Diagnosis Date  . Anxiety   . Drug addiction (HCC)   . Hypertension    Has been having headaches every night for a month.  . Shortness of breath    with panic attacks   Past Surgical History:  Procedure Laterality Date  . Admission  10/06/11   Behavioral Health Admission; polysubstance abuse/dependence (benzos, alcohol)  . INCISION / DRAINAGE HAND / FINGER  "had an infection"  . NO PAST SURGERIES     Family History  Problem Relation Age of Onset  . Bipolar disorder Mother    Social History  Substance Use Topics  . Smoking status: Current Every Day Smoker    Packs/day: 0.00    Types: Cigarettes  . Smokeless tobacco: Not on file  . Alcohol use No    Review of Systems  Denies: HEADACHE, NAUSEA, ABDOMINAL PAIN, CHEST PAIN, CONGESTION, DYSURIA, SHORTNESS OF BREATH  Allergies  Review of patient's allergies indicates no known allergies.  Home Medications   Prior to Admission medications   Medication Sig Start Date End Date Taking? Authorizing Provider  azithromycin (ZITHROMAX) 250 MG tablet Take 2 tabs PO x 1 dose, then 1 tab PO QD x 4 days 05/12/13   Tarry Kosodd M McGrath, MD  benzonatate (TESSALON PERLES) 100 MG capsule Take 2 capsules (200 mg total) by mouth 3 (three) times daily as needed for cough. 05/12/13   Tarry Kosodd M McGrath, MD  benzonatate (TESSALON) 100 MG  capsule Take 1 capsule (100 mg total) by mouth 3 (three) times daily as needed for cough. 05/12/12   Jimmie MollyPaolo Coll, MD  citalopram (CELEXA) 20 MG tablet Take 1 tablet (20 mg total) by mouth daily. For depression. 01/06/12 01/05/13  Ethelda ChickKristi M Smith, MD  cyclobenzaprine (FLEXERIL) 10 MG tablet Take 1 tablet (10 mg total) by mouth 2 (two) times daily as needed for muscle spasms. 09/25/15   Tharon AquasFrank C Geroldine Esquivias, PA  fexofenadine (ALLEGRA) 60 MG tablet Take 1 tablet (60 mg total) by mouth daily. 05/12/12 05/19/12  Jimmie MollyPaolo Coll, MD  omeprazole (PRILOSEC) 40 MG capsule Take 1 capsule (40 mg total) by mouth daily. 05/12/12   Jimmie MollyPaolo Coll, MD  ondansetron (ZOFRAN) 4 MG tablet Take 1 tablet (4 mg total) by mouth every 8 (eight) hours as needed for nausea. 05/12/12   Jimmie MollyPaolo Coll, MD  ondansetron (ZOFRAN) 4 MG tablet Take 1 tablet (4 mg total) by mouth every 6 (six) hours. 09/25/15   Tharon AquasFrank C Edwen Mclester, PA  QUEtiapine (SEROQUEL) 25 MG tablet Take by mouth 1/2 to 1 three times a day for anxiety and TWO at bedtime for insomnia 10/05/11   Mike CrazeEdwin O Walker, MD  traMADol (ULTRAM) 50 MG tablet Take 1 tablet (50 mg total) by mouth every 6 (six) hours as needed. 09/25/15   Tharon AquasFrank C Jolonda Gomm, PA  traZODone (DESYREL) 50 MG tablet Take 1-2 tablets (50-100  mg total) by mouth at bedtime and may repeat dose one time if needed. For insomnia. 10/05/11 11/04/11  Mike CrazeEdwin O Walker, MD  traZODone (DESYREL) 50 MG tablet Take 1.5 tablets (75 mg total) by mouth at bedtime. 01/06/12   Ethelda ChickKristi M Smith, MD   Meds Ordered and Administered this Visit  Medications - No data to display  BP 134/74 (BP Location: Right Arm)   Pulse 104   Temp 97.7 F (36.5 C) (Oral)   Resp 18   SpO2 98%  No data found.   Physical Exam NURSES NOTES AND VITAL SIGNS REVIEWED. CONSTITUTIONAL: Well developed, well nourished, no acute distress HEENT: normocephalic, atraumatic EYES: Conjunctiva normal NECK:normal ROM, supple, no adenopathy PULMONARY:No respiratory distress, normal  effort ABDOMINAL: Soft, ND, NT BS+, No CVAT MUSCULOSKELETAL: Normal ROM of all extremities, right flank is without tenderness. There is no tenderness in the lumbar area or the sciatic area of the right side. The motion is good. Abdominal exam is normal. SKIN: warm and dry without rash PSYCHIATRIC: Mood and affect, behavior are normal  Urgent Care Course   Clinical Course    Procedures (including critical care time)  Labs Review Labs Reviewed  POCT URINALYSIS DIP (DEVICE)    Imaging Review No results found.   Visual Acuity Review  Right Eye Distance:   Left Eye Distance:   Bilateral Distance:    Right Eye Near:   Left Eye Near:    Bilateral Near:        Tramadol Flexeril and omeprazole provided to the patient. Urinalysis is normal no suggestion of kidney stone Imaging studies are required. MDM   1. Right-sided low back pain with right-sided sciatica   2. Gastritis     Patient is reassured that there are no issues that require transfer to higher level of care at this time or additional tests. Patient is advised to continue home symptomatic treatment. Patient is advised that if there are new or worsening symptoms to attend the emergency department, contact primary care provider, or return to UC. Instructions of care provided discharged home in stable condition.    THIS NOTE WAS GENERATED USING A VOICE RECOGNITION SOFTWARE PROGRAM. ALL REASONABLE EFFORTS  WERE MADE TO PROOFREAD THIS DOCUMENT FOR ACCURACY.  I have verbally reviewed the discharge instructions with the patient. A printed AVS was given to the patient.  All questions were answered prior to discharge.      Tharon AquasFrank C Wake Conlee, PA 09/25/15 2006

## 2015-10-07 ENCOUNTER — Encounter (HOSPITAL_COMMUNITY): Payer: Self-pay | Admitting: Family Medicine

## 2015-10-07 ENCOUNTER — Ambulatory Visit (HOSPITAL_COMMUNITY)
Admission: EM | Admit: 2015-10-07 | Discharge: 2015-10-07 | Disposition: A | Discharge status: Home / Self Care | Payer: Self-pay | Attending: Physician Assistant | Admitting: Physician Assistant

## 2015-10-07 DIAGNOSIS — K2970 Gastritis, unspecified, without bleeding: Secondary | ICD-10-CM

## 2015-10-07 LAB — POCT URINALYSIS DIP (DEVICE)
Bilirubin Urine: NEGATIVE
Glucose, UA: NEGATIVE mg/dL
HGB URINE DIPSTICK: NEGATIVE
Ketones, ur: NEGATIVE mg/dL
Leukocytes, UA: NEGATIVE
NITRITE: NEGATIVE
PH: 8.5 — AB (ref 5.0–8.0)
PROTEIN: NEGATIVE mg/dL
Specific Gravity, Urine: 1.015 (ref 1.005–1.030)
UROBILINOGEN UA: 0.2 mg/dL (ref 0.0–1.0)

## 2015-10-07 LAB — POCT I-STAT, CHEM 8
BUN: 11 mg/dL (ref 6–20)
CALCIUM ION: 1.19 mmol/L (ref 1.15–1.40)
Chloride: 100 mmol/L — ABNORMAL LOW (ref 101–111)
Creatinine, Ser: 1 mg/dL (ref 0.61–1.24)
GLUCOSE: 97 mg/dL (ref 65–99)
HCT: 42 % (ref 39.0–52.0)
Hemoglobin: 14.3 g/dL (ref 13.0–17.0)
Potassium: 4.3 mmol/L (ref 3.5–5.1)
SODIUM: 140 mmol/L (ref 135–145)
TCO2: 27 mmol/L (ref 0–100)

## 2015-10-07 MED ORDER — OMEPRAZOLE 20 MG PO CPDR: 20.0000 mg | DELAYED_RELEASE_CAPSULE | Freq: Every day | ORAL | 1 refills | Status: DC

## 2015-10-07 MED ORDER — ONDANSETRON HCL 4 MG PO TABS: 4.0000 mg | ORAL_TABLET | Freq: Three times a day (TID) | ORAL | 0 refills | Status: AC | PRN

## 2015-10-07 MED ORDER — OMEPRAZOLE 20 MG PO CPDR: 20.0000 mg | DELAYED_RELEASE_CAPSULE | Freq: Every day | ORAL | 1 refills | Status: AC

## 2015-10-07 MED ORDER — ONDANSETRON 4 MG PO TBDP
ORAL_TABLET | ORAL | Status: AC
  Filled 2015-10-07: qty 1

## 2015-10-07 MED ORDER — ONDANSETRON 4 MG PO TBDP
4.0000 mg | ORAL_TABLET | Freq: Once | ORAL | Status: AC
  Administered 2015-10-07: 4 mg via ORAL

## 2015-10-07 NOTE — ED Provider Notes (Signed)
CSN: [id number]     Arrival date & time 10/07/15  1511 History   First MD Initiated Contact with Patient 10/07/15 1603     Chief Complaint  Patient presents with  . Nausea  . Emesis  . Chills   (Consider location/radiation/quality/duration/timing/severity/associated sxs/prior Treatment) 25 yo presents with N, V and chills x 2 days. He was here 10 days ago and diagnosed with gastritis. He reports his symptoms improved from that visit. He has no abdominal pain. His back is still sore from previous work up. No fevers that are noted. No respiratory symtpoms. See remainder of ROS    Past Medical History:  Diagnosis Date  . Anxiety   . Drug addiction (HCC)   . Hypertension    Has been having headaches every night for a month.  . Shortness of breath    with panic attacks   Past Surgical History:  Procedure Laterality Date  . Admission  10/06/11   Behavioral Health Admission; polysubstance abuse/dependence (benzos, alcohol)  . INCISION / DRAINAGE HAND / FINGER  "had an infection"  . NO PAST SURGERIES     Family History  Problem Relation Age of Onset  . Bipolar disorder Mother    Social History  Substance Use Topics  . Smoking status: Current Every Day Smoker    Packs/day: 0.00    Types: Cigarettes  . Smokeless tobacco: Never Used  . Alcohol use No    Review of Systems  Constitutional: Positive for chills and fever.  HENT: Negative for sore throat.   Respiratory: Negative for cough, shortness of breath and wheezing.   Cardiovascular: Negative for leg swelling.  Gastrointestinal: Positive for nausea and vomiting. Negative for abdominal pain, blood in stool and constipation.  Genitourinary: Negative.   Musculoskeletal: Positive for back pain.  Skin: Negative.   Neurological: Negative.   Psychiatric/Behavioral: Negative.     Allergies  Review of patient's allergies indicates no known allergies.  Home Medications   Prior to Admission medications   Medication Sig Start  Date End Date Taking? Authorizing Provider  omeprazole (PRILOSEC) 20 MG capsule Take 1 capsule (20 mg total) by mouth daily. 10/07/15   Riki Sheer, PA-C  ondansetron (ZOFRAN) 4 MG tablet Take 1 tablet (4 mg total) by mouth every 8 (eight) hours as needed for nausea or vomiting. 10/07/15   Riki Sheer, PA-C   Meds Ordered and Administered this Visit   Medications  ondansetron (ZOFRAN-ODT) disintegrating tablet 4 mg (4 mg Oral Given 10/07/15 1617)    BP 122/71 (BP Location: Right Arm)   Pulse 76   Temp 98.9 F (37.2 C) (Oral)   Resp 16   SpO2 100%  No data found.   Physical Exam  Constitutional: He is oriented to person, place, and time. He appears well-developed and well-nourished. No distress.  HENT:  Head: Normocephalic and atraumatic.  Eyes: Pupils are equal, round, and reactive to light.  Dilated pupils bilaterally,  Cardiovascular: Normal rate and regular rhythm.   Pulmonary/Chest: Effort normal and breath sounds normal.  Abdominal: Soft. Bowel sounds are normal.  Mild tenderness along the epigastric region to deep palpation, remainder of abdominal exam normal  Neurological: He is alert and oriented to person, place, and time.  Skin: Skin is warm and dry. He is not diaphoretic.  Psychiatric: His behavior is normal.  Nursing note and vitals reviewed.   Urgent Care Course   Clinical Course    Procedures (including critical care time)  Labs Review Labs Reviewed  POCT URINALYSIS DIP (DEVICE) - Abnormal; Notable for the following:       Result Value   pH 8.5 (*)    All other components within normal limits  POCT I-STAT, CHEM 8 - Abnormal; Notable for the following:    Chloride 100 (*)    All other components within normal limits    Imaging Review No results found.   Visual Acuity Review  Right Eye Distance:   Left Eye Distance:   Bilateral Distance:    Right Eye Near:   Left Eye Near:    Bilateral Near:         MDM   1. Gastritis     Secondary to NSAID's or possibly current viral symtpoms. No emergent needs today. Screening istat normal. Patient is hydrated and non-toxic and appropriate for d/C.  Treat with omeprazole daily for 2 weeks and Zofran prn. If he continues to have gastritis symptoms then f/u with [facility]. Otherwise if worsens then f/u in the ED.      Riki SheerMichelle G Shemia Bevel, PA-C 10/07/15 [phone]

## 2015-10-07 NOTE — ED Triage Notes (Addendum)
Pt here for N,V , chills, x 2 days. sts he is having some back pain around kidneys. Denies any medication use

## 2015-10-07 NOTE — Discharge Instructions (Signed)
Prior use of Ibuprofen and perhaps a new virus has irritated the lining of your stomach. Will treat with omeprazole daily, and Zofran as needed for nausea. If you worsen go to the ED. Your labs and urine here looked good. Keep hydrated.

## 2015-10-11 ENCOUNTER — Emergency Department (HOSPITAL_COMMUNITY): Payer: Self-pay

## 2015-10-11 ENCOUNTER — Encounter (HOSPITAL_COMMUNITY): Payer: Self-pay | Admitting: *Deleted

## 2015-10-11 ENCOUNTER — Emergency Department (HOSPITAL_COMMUNITY)
Admission: EM | Admit: 2015-10-11 | Discharge: 2015-10-12 | Disposition: A | Discharge status: Home / Self Care | Payer: Self-pay | Attending: Emergency Medicine | Admitting: Emergency Medicine

## 2015-10-11 DIAGNOSIS — K2970 Gastritis, unspecified, without bleeding: Secondary | ICD-10-CM | POA: Insufficient documentation

## 2015-10-11 DIAGNOSIS — I10 Essential (primary) hypertension: Secondary | ICD-10-CM | POA: Insufficient documentation

## 2015-10-11 DIAGNOSIS — F17210 Nicotine dependence, cigarettes, uncomplicated: Secondary | ICD-10-CM | POA: Insufficient documentation

## 2015-10-11 DIAGNOSIS — R1013 Epigastric pain: Secondary | ICD-10-CM

## 2015-10-11 LAB — COMPREHENSIVE METABOLIC PANEL
ALT: 21 U/L (ref 17–63)
ANION GAP: 9 (ref 5–15)
AST: 18 U/L (ref 15–41)
Albumin: 4.5 g/dL (ref 3.5–5.0)
Alkaline Phosphatase: 66 U/L (ref 38–126)
BUN: 11 mg/dL (ref 6–20)
CHLORIDE: 106 mmol/L (ref 101–111)
CO2: 24 mmol/L (ref 22–32)
Calcium: 9.5 mg/dL (ref 8.9–10.3)
Creatinine, Ser: 1.2 mg/dL (ref 0.61–1.24)
Glucose, Bld: 122 mg/dL — ABNORMAL HIGH (ref 65–99)
POTASSIUM: 3.8 mmol/L (ref 3.5–5.1)
SODIUM: 139 mmol/L (ref 135–145)
Total Bilirubin: 0.4 mg/dL (ref 0.3–1.2)
Total Protein: 7.7 g/dL (ref 6.5–8.1)

## 2015-10-11 LAB — CBC
HEMATOCRIT: 41.9 % (ref 39.0–52.0)
HEMOGLOBIN: 14.2 g/dL (ref 13.0–17.0)
MCH: 30 pg (ref 26.0–34.0)
MCHC: 33.9 g/dL (ref 30.0–36.0)
MCV: 88.6 fL (ref 78.0–100.0)
PLATELETS: 215 10*3/uL (ref 150–400)
RBC: 4.73 MIL/uL (ref 4.22–5.81)
RDW: 12.9 % (ref 11.5–15.5)
WBC: 11.8 10*3/uL — AB (ref 4.0–10.5)

## 2015-10-11 LAB — LIPASE, BLOOD: LIPASE: 38 U/L (ref 11–51)

## 2015-10-11 MED ORDER — ONDANSETRON HCL 4 MG/2ML IJ SOLN
4.0000 mg | Freq: Once | INTRAMUSCULAR | Status: AC
  Administered 2015-10-12: 4 mg via INTRAVENOUS
  Filled 2015-10-11: qty 2

## 2015-10-11 MED ORDER — ONDANSETRON 4 MG PO TBDP
4.0000 mg | ORAL_TABLET | Freq: Once | ORAL | Status: AC | PRN
  Administered 2015-10-11: 4 mg via ORAL

## 2015-10-11 MED ORDER — ONDANSETRON 4 MG PO TBDP
ORAL_TABLET | ORAL | Status: AC
  Filled 2015-10-11: qty 1

## 2015-10-11 MED ORDER — SODIUM CHLORIDE 0.9 % IV BOLUS (SEPSIS)
1000.0000 mL | Freq: Once | INTRAVENOUS | Status: AC
Start: 2015-10-11 — End: 2015-10-12
  Administered 2015-10-12: 1000 mL via INTRAVENOUS

## 2015-10-11 NOTE — ED Triage Notes (Signed)
Pt reports N/V for 1 week. Pt states that [facility] and told to come here if he wasn't better. Pt reports taking zofran and prilosec with no relief.

## 2015-10-11 NOTE — ED Provider Notes (Signed)
[facility]-EMERGENCY DEPT Provider Note   CSN: [id number] Arrival date & time: 10/11/15  1702     History   Chief Complaint Chief Complaint  Patient presents with  . Nausea    HPI Carlos Lester is a 25 y.o. male.  The history is provided by the patient and medical records. No language interpreter was used.   Carlos Lester is a 25 y.o. male  with a PMH of HTN, anxiety, back pain who presents to the Emergency Department complaining of persistent burning epigastric pain, nausea and vomiting 2 weeks. Patient was seen at urgent care onset of symptoms on 8/20 and started on omeprazole. He states he is trying to take this as directed, however he often throws up afterwards and is unsure if he is getting medication. He was then seen again at urgent care on 9/01 where he had a normal chem 8 and urinalysis. Given Zofran, but unfortunately due to financial reasons he was unable to get full prescription and only could pay for 6 pills. Zofran did help but he is now out of this medication. He was encouraged to follow up with GI or come to ER if symptoms persisted. No fevers, new back pain, dysuria, constipation, diarrhea, penile discharge. No alleviating or aggravating factors noted. Patient states every time he eats or drinks something, he will throw it up. Of note, patient endorses taking multiple Advil PM and ibuprofen throughout the day for his back pain over the last 2 months. Denies ETOH use.    Past Medical History:  Diagnosis Date  . Anxiety   . Drug addiction (HCC)   . Hypertension    Has been having headaches every night for a month.  . Shortness of breath    with panic attacks    Patient Active Problem List   Diagnosis Date Noted  . Polysubstance abuse 10/04/2011  . Benzodiazepine dependence (HCC) 10/02/2011    Past Surgical History:  Procedure Laterality Date  . Admission  10/06/11   Behavioral Health Admission; polysubstance abuse/dependence (benzos, alcohol)  . INCISION /  DRAINAGE HAND / FINGER  "had an infection"  . NO PAST SURGERIES         Home Medications    Prior to Admission medications   Medication Sig Start Date End Date Taking? Authorizing Provider  omeprazole (PRILOSEC) 20 MG capsule Take 1 capsule (20 mg total) by mouth daily. Patient taking differently: Take 40 mg by mouth daily.  10/07/15  Yes Dillard CannonMichelle G Young, PA-C  ondansetron (ZOFRAN) 4 MG tablet Take 1 tablet (4 mg total) by mouth every 8 (eight) hours as needed for nausea or vomiting. 10/07/15  Yes Dillard CannonMichelle G Young, PA-C  ondansetron (ZOFRAN ODT) 4 MG disintegrating tablet Take 1 tablet (4 mg total) by mouth every 8 (eight) hours as needed for nausea or vomiting. 10/12/15   Carlos PicketJaime Pilcher Bryanna Yim, PA-C    Family History Family History  Problem Relation Age of Onset  . Bipolar disorder Mother     Social History Social History  Substance Use Topics  . Smoking status: Current Every Day Smoker    Packs/day: 0.00    Types: Cigarettes  . Smokeless tobacco: Never Used  . Alcohol use No     Allergies   Review of patient's allergies indicates no known allergies.   Review of Systems Review of Systems  Constitutional: Negative for chills and fever.  HENT: Negative for congestion.   Eyes: Negative for visual disturbance.  Respiratory: Negative for cough and shortness  of breath.   Cardiovascular: Negative.   Gastrointestinal: Positive for abdominal pain, nausea and vomiting. Negative for blood in stool, constipation and diarrhea.  Genitourinary: Negative for discharge, dysuria, penile pain and penile swelling.  Musculoskeletal: Negative for myalgias.  Skin: Negative for rash.  Neurological: Negative for headaches.     Physical Exam Updated Vital Signs BP 141/82   Pulse (!) 53   Temp 98.2 F (36.8 C) (Oral)   Resp 16   SpO2 99%   Physical Exam  Constitutional: He is oriented to person, place, and time. He appears well-developed and well-nourished. No distress.  HENT:  Head:  Normocephalic and atraumatic.  OP clear. Dry mucus membranes.   Cardiovascular: Normal rate, regular rhythm and normal heart sounds.   Pulmonary/Chest: Effort normal and breath sounds normal. No respiratory distress.  Abdominal: Soft. Bowel sounds are normal. He exhibits no distension. There is no rebound and no guarding.  Tenderness to palpation of the epigastrium. Negative Murphy's.  Musculoskeletal: Normal range of motion.  Neurological: He is alert and oriented to person, place, and time.  Skin: Skin is warm and dry.  Nursing note and vitals reviewed.    ED Treatments / Results  Labs (all labs ordered are listed, but only abnormal results are displayed) Labs Reviewed  COMPREHENSIVE METABOLIC PANEL - Abnormal; Notable for the following:       Result Value   Glucose, Bld 122 (*)    All other components within normal limits  CBC - Abnormal; Notable for the following:    WBC 11.8 (*)    All other components within normal limits  LIPASE, BLOOD  URINALYSIS, ROUTINE W REFLEX MICROSCOPIC (NOT AT [facility])    EKG  EKG Interpretation None       Radiology US Abdomen Limited Ruq  Result Date: 10/11/2015 CLINICAL DATA:  Nausea and vomiting for 1 week. EXAM: US ABDOMEN LIMITED - RIGHT UPPER QUADRANT COMPARISON:  CT abdomen pelvis 06/13/2011 FINDINGS: Gallbladder: No gallstones or wall thickening visualized. No sonographic Murphy sign noted by sonographer. Common bile duct: Diameter: 3 mm Liver: No focal lesion identified. Within normal limits in parenchymal echogenicity. IMPRESSION: Normal scan. Electronically Signed   By: Deatra Robinson M.D.   On: 10/11/2015 23:37    Procedures Procedures (including critical care time)  Medications Ordered in ED Medications  ondansetron (ZOFRAN-ODT) 4 MG disintegrating tablet (not administered)  ondansetron (ZOFRAN-ODT) disintegrating tablet 4 mg (4 mg Oral Given 10/11/15 1723)  sodium chloride 0.9 % bolus 1,000 mL (0 mLs Intravenous Stopped 10/12/15  0105)  ondansetron (ZOFRAN) injection 4 mg (4 mg Intravenous Given 10/12/15 0014)     Initial Impression / Assessment and Plan / ED Course  I have reviewed the triage vital signs and the nursing notes.  Pertinent labs & imaging results that were available during my care of the patient were reviewed by me and considered in my medical decision making (see chart for details).  Clinical Course   AYRTON MCVAY is a 25 y.o. male who presents to ED for burning epigastric pain associated with n/v x 2 weeks. Seen by urgent care twice in this timeframe. Has been taking a number of NSAID's recently for back injury and appears to have NSAID induced gastritis. Nonsurgical abdomen on exam. Afebrile and hemodynamically stable. Labs reviewed and reassuring. Ultrasound performed and negative. Patient states Zofran worked well for nausea, vomiting however he was unable to afford him prescription. Zofran Rx given and coupon provided for $17-patient states he can't afford this.  No emesis in ED following Zofran administration. GI follow-up strongly encouraged to occluded and discharge information. Continue Pepcid. Reasons to return to ED discussed and all questions answered.  Final Clinical Impressions(s) / ED Diagnoses   Final diagnoses:  Epigastric pain  Gastritis    New Prescriptions New Prescriptions   ONDANSETRON (ZOFRAN ODT) 4 MG DISINTEGRATING TABLET    Take 1 tablet (4 mg total) by mouth every 8 (eight) hours as needed for nausea or vomiting.     [facility] Delaney Perona, PA-C 10/12/15 0129    Mancel Bale, MD 10/12/15 [phone]

## 2015-10-11 NOTE — ED Notes (Signed)
Asked pt for urine specimen. Pt stated he could not give urine at the time. Pt is waiting on fluids by IV at bedside per RN.

## 2015-10-11 NOTE — ED Notes (Signed)
Patient transported to Ultrasound 

## 2015-10-12 LAB — URINALYSIS, ROUTINE W REFLEX MICROSCOPIC
BILIRUBIN URINE: NEGATIVE
Glucose, UA: NEGATIVE mg/dL
HGB URINE DIPSTICK: NEGATIVE
KETONES UR: NEGATIVE mg/dL
Leukocytes, UA: NEGATIVE
Nitrite: NEGATIVE
PROTEIN: NEGATIVE mg/dL
Specific Gravity, Urine: 1.01 (ref 1.005–1.030)
pH: 6.5 (ref 5.0–8.0)

## 2015-10-12 MED ORDER — ONDANSETRON 4 MG PO TBDP: 4.0000 mg | ORAL_TABLET | Freq: Three times a day (TID) | ORAL | 0 refills | Status: AC | PRN

## 2015-10-12 NOTE — Discharge Instructions (Signed)
Zofran as needed for nausea. The coupon I have attached is only valid at a [facility] pharmacy. This will be the cheapest option for getting this medication filled. Continue taking Prilosec. Avoid NSAID's (ibuprofen, aleve, Advil, Naproxen, etc) and alcohol. This will make pain worse.   Please call the GI clinic listed to schedule a follow up at the next available appointment.  Return to ER for new or worsening symptoms, any additional concerns.

## 2016-06-10 ENCOUNTER — Emergency Department (HOSPITAL_COMMUNITY): Payer: Self-pay

## 2016-06-10 ENCOUNTER — Emergency Department (HOSPITAL_COMMUNITY)
Admission: EM | Admit: 2016-06-10 | Discharge: 2016-06-11 | Disposition: A | Discharge status: Home / Self Care | Payer: Self-pay | Attending: Physician Assistant | Admitting: Physician Assistant

## 2016-06-10 ENCOUNTER — Encounter (HOSPITAL_COMMUNITY): Payer: Self-pay | Admitting: Emergency Medicine

## 2016-06-10 DIAGNOSIS — Z79899 Other long term (current) drug therapy: Secondary | ICD-10-CM | POA: Insufficient documentation

## 2016-06-10 DIAGNOSIS — F17210 Nicotine dependence, cigarettes, uncomplicated: Secondary | ICD-10-CM | POA: Insufficient documentation

## 2016-06-10 DIAGNOSIS — T401X1A Poisoning by heroin, accidental (unintentional), initial encounter: Secondary | ICD-10-CM | POA: Insufficient documentation

## 2016-06-10 DIAGNOSIS — I10 Essential (primary) hypertension: Secondary | ICD-10-CM | POA: Insufficient documentation

## 2016-06-10 LAB — CBC
HEMATOCRIT: 46.2 % (ref 39.0–52.0)
HEMOGLOBIN: 15.4 g/dL (ref 13.0–17.0)
MCH: 29.6 pg (ref 26.0–34.0)
MCHC: 33.3 g/dL (ref 30.0–36.0)
MCV: 88.7 fL (ref 78.0–100.0)
Platelets: 249 10*3/uL (ref 150–400)
RBC: 5.21 MIL/uL (ref 4.22–5.81)
RDW: 14.1 % (ref 11.5–15.5)
WBC: 12.3 10*3/uL — ABNORMAL HIGH (ref 4.0–10.5)

## 2016-06-10 LAB — I-STAT CHEM 8, ED
BUN: 12 mg/dL (ref 6–20)
CALCIUM ION: 1.08 mmol/L — AB (ref 1.15–1.40)
CREATININE: 1.1 mg/dL (ref 0.61–1.24)
Chloride: 103 mmol/L (ref 101–111)
GLUCOSE: 146 mg/dL — AB (ref 65–99)
HCT: 46 % (ref 39.0–52.0)
Hemoglobin: 15.6 g/dL (ref 13.0–17.0)
Potassium: 3.2 mmol/L — ABNORMAL LOW (ref 3.5–5.1)
Sodium: 142 mmol/L (ref 135–145)
TCO2: 24 mmol/L (ref 0–100)

## 2016-06-10 MED ORDER — SODIUM CHLORIDE 0.9 % IV BOLUS (SEPSIS)
1000.0000 mL | Freq: Once | INTRAVENOUS | Status: AC
  Administered 2016-06-10: 1000 mL via INTRAVENOUS

## 2016-06-10 NOTE — ED Provider Notes (Signed)
[facility] DEPT Provider Note   CSN: [id number] Arrival date & time: 06/10/16  2156     History   Chief Complaint Chief Complaint  Patient presents with  . Drug Overdose  . Tachycardia    HPI Rodney CruiseJoshua A Milledge is a 26 y.o. male.  The history is provided by the patient and the EMS personnel.  Ingestion  This is a recurrent problem. The current episode started 1 to 2 hours ago. The problem occurs rarely. The problem has been rapidly improving. Pertinent negatives include no chest pain, no abdominal pain, no headaches and no shortness of breath. Nothing aggravates the symptoms. Relieved by: narcan. The treatment provided significant relief.    Past Medical History:  Diagnosis Date  . Anxiety   . Drug addiction (HCC)   . Hypertension    Has been having headaches every night for a month.  . Shortness of breath    with panic attacks    Patient Active Problem List   Diagnosis Date Noted  . Polysubstance abuse 10/04/2011  . Benzodiazepine dependence (HCC) 10/02/2011    Past Surgical History:  Procedure Laterality Date  . Admission  10/06/11   Behavioral Health Admission; polysubstance abuse/dependence (benzos, alcohol)  . INCISION / DRAINAGE HAND / FINGER  "had an infection"  . NO PAST SURGERIES         Home Medications    Prior to Admission medications   Medication Sig Start Date End Date Taking? Authorizing Provider  omeprazole (PRILOSEC) 20 MG capsule Take 1 capsule (20 mg total) by mouth daily. Patient not taking: Reported on 06/10/2016 10/07/15   Riki SheerYoung, Michelle G, PA-C  ondansetron (ZOFRAN ODT) 4 MG disintegrating tablet Take 1 tablet (4 mg total) by mouth every 8 (eight) hours as needed for nausea or vomiting. Patient not taking: Reported on 06/10/2016 10/12/15   Ward, Chase PicketJaime Pilcher, PA-C  ondansetron (ZOFRAN) 4 MG tablet Take 1 tablet (4 mg total) by mouth every 8 (eight) hours as needed for nausea or vomiting. Patient not taking: Reported on 06/10/2016 10/07/15    Riki SheerYoung, Michelle G, PA-C    Family History Family History  Problem Relation Age of Onset  . Bipolar disorder Mother     Social History Social History  Substance Use Topics  . Smoking status: Current Every Day Smoker    Packs/day: 0.00    Types: Cigarettes  . Smokeless tobacco: Never Used  . Alcohol use No     Allergies   Patient has no known allergies.   Review of Systems Review of Systems  Constitutional: Negative for fever.  HENT: Negative.   Respiratory: Negative for shortness of breath.   Cardiovascular: Negative for chest pain.  Gastrointestinal: Negative for abdominal pain, diarrhea and nausea.  Genitourinary: Negative.   Skin: Negative.   Neurological: Negative for headaches.  All other systems reviewed and are negative.    Physical Exam Updated Vital Signs BP 124/76   Pulse (!) 105   Temp 97.9 F (36.6 C) (Oral)   Resp (!) 23   SpO2 98%   Physical Exam  Constitutional: He is oriented to person, place, and time. He appears well-developed and well-nourished.  HENT:  Head: Normocephalic and atraumatic.  Mouth/Throat: Oropharynx is clear and moist.  Eyes: Conjunctivae and EOM are normal. Pupils are equal, round, and reactive to light.  Neck: Neck supple.  Cardiovascular: Normal rate and regular rhythm.   No murmur heard. Pulmonary/Chest: Effort normal and breath sounds normal. No respiratory distress.  Abdominal: Soft. There  is no tenderness.  Musculoskeletal: He exhibits no edema.  Neurological: He is alert and oriented to person, place, and time. No cranial nerve deficit. He exhibits normal muscle tone. Coordination normal.  Skin: Skin is warm and dry.  Psychiatric: He has a normal mood and affect.  Nursing note and vitals reviewed.    ED Treatments / Results  Labs (all labs ordered are listed, but only abnormal results are displayed) Labs Reviewed  CBC - Abnormal; Notable for the following:       Result Value   WBC 12.3 (*)    All other  components within normal limits  I-STAT CHEM 8, ED - Abnormal; Notable for the following:    Potassium 3.2 (*)    Glucose, Bld 146 (*)    Calcium, Ion 1.08 (*)    All other components within normal limits    EKG  EKG Interpretation  Date/Time:  Sunday Jun 10 2016 21:59:10 EDT Ventricular Rate:  115 PR Interval:    QRS Duration: 73 QT Interval:  330 QTC Calculation: 457 R Axis:   19 Text Interpretation:  Sinus tachycardia RSR' in V1 or V2, probably normal variant No old tracing to compare Confirmed by Frederick Peers [phone]) on 06/11/2016 11:41:14 AM       Radiology Ct Head Wo Contrast  Result Date: 06/10/2016 CLINICAL DATA:  Heroin overdose.  Possible head injury. EXAM: CT HEAD WITHOUT CONTRAST TECHNIQUE: Contiguous axial images were obtained from the base of the skull through the vertex without intravenous contrast. COMPARISON:  None. FINDINGS: Brain: No evidence of acute infarction, hemorrhage, hydrocephalus, extra-axial collection or mass lesion/mass effect. Vascular: No hyperdense vessel or unexpected calcification. Skull: Normal. Negative for fracture or focal lesion. Sinuses/Orbits: Mucosal thickening seen in the left maxillary sinus. Paranasal sinuses, mastoid air cells, and middle ears are otherwise within normal limits. Other: None. IMPRESSION: 1. No acute intracranial abnormalities identified. Electronically Signed   By: Gerome Sam III M.D   On: 06/10/2016 22:41    Procedures Procedures (including critical care time)  Medications Ordered in ED Medications  sodium chloride 0.9 % bolus 1,000 mL (0 mLs Intravenous Stopped 06/11/16 0002)  naloxone (NARCAN) nasal spray 4 mg/0.1 mL (1 spray Nasal Provided for home use 06/11/16 0017)     Initial Impression / Assessment and Plan / ED Course  I have reviewed the triage vital signs and the nursing notes.  Pertinent labs & imaging results that were available during my care of the patient were reviewed by me and considered in my  medical decision making (see chart for details).     Patient is a 26 year old male brought in by EMS for Concerns of heroin overdose. He was found down around 8:30 by his roommate and received 12 mg and Narcan by his roommate. EMS said he was still very lethargic for them and found to be tachycardic with what they reported as A. fib with RVR. Patient states that he has not done heroin in a few weeks and used his normal amount. This is not a suicide attempt. Upon arrival he is a GCS of 15 with reassuring vital signs. EKG with sinus tachycardia without prolonged intervals or ischemic changes. Given reports of repetitive questioning and possible falling hitting his head CT head obtained without acute findings.  Pt observed for greater than 3 hours in the ER without lethargic. Patient will be prescribed a Narcan pack  I have reviewed all results. Patient stable for discharge home.  I have reviewed all results  with the patient. Advised to f/u with pcp and given drug treatment resources. Patient agrees to stated plan. All questions answered. Advised to call or return to have any questions, new symptoms, change in symptoms, or symptoms that they do not understand.  Final diagnoses:  Accidental overdose of heroin, initial encounter    New Prescriptions Discharge Medication List as of 06/11/2016 12:02 AM       Marijean Niemann, MD 06/11/16 1610    Blane Ohara, MD 06/17/16 [phone]

## 2016-06-10 NOTE — ED Notes (Signed)
Patient transported to CT 
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
## 2016-06-10 NOTE — ED Notes (Signed)
Pt back from CT

## 2016-06-10 NOTE — Discharge Instructions (Signed)
Substance Abuse Treatment Programs ° °Intensive Outpatient Programs °[facility]     °[address]      °[place], [place]                   °[phone]      ° °[facility] °[address] °[place], [place] °[phone] ° °[facility]     °(Inpatient and outpatient)     °[address]           °[phone]   ° °[facility] °[phone] (Suboxone and Methadone) ° °[address]      °[place], [place] [address]      °[phone]      ° °[address] °[place], [place] °852-3033 ° °[facility] (Outpatient/Inpatient, Chemical)    °(insurance only) [phone]      °       °[facility]Groups & Residential) °[place], [place] °[phone] ° °   °[facility]     °[address]     °[place], [place]      °[phone]      ° °[facility] (for caregivers and family) °[address] °[place], [place] °336-299-4655 ° ° ° ° ° °Residential Treatment Programs °[facility]      °[address], [place], [place] [address]  °[phone]      ° °[facility] °[address] [place], [place] [address] °[phone] ° °[facility]        °[phone]      ° °[facility] °[phone] ° °[facility]             °[address]                                         °[place], [place]                                                °[phone] or 336-784-9470                              ° °[facility] [place] °[address] °[place], [address] °[phone] ° °[facility]    °[address]      °[place], [place]     °[phone]      ° °[facility] °[address] °[place], [place] °336-285-9073 ° °[facility]   °[address]     °[place], [place] [address]     °[phone]      °Admissions: 8am-3pm M-F ° °[facility] °[address] °[place] °336-227-7417 ° °[facility]: Residential Program (90 Days)   °[place], [place]      °336-725-8389 or 800-758-6077    ° °[facility] °[place],  °(Walk in Hours over the weekend or by referral) ° °[facility] °[address], [place],  [address] °([phone] ° °Crisis Mobile: [facility]:  1-877-626-1772 (for crisis response 24 hours a day) °[facility] Hotline:      1-800-256-2452 °Outpatient Psychiatry and Counseling ° °[facility]: Mobile Crisis   Management 24 hours:  1-877-626-1772 ° °[facility] [place] sliding scale fee and walk in schedule: M-F 8am-12pm/1pm-3pm °[address]  °[place], [place] [address] °[phone] ° °[facility] °[address] °[place], [place] [address] °336-703-9650 ° °[facility] ([facility])- new patient walk-in appointments available Monday - Friday 8am -3pm.          °[address] °[place], [place] [address] °336-676-6840 or crisis line- 336-676-6905 ° °[facility] Intensive Outpatient Therapy Program °[address] °[place], [place] [address] °336-832-9804 ° °[facility]                  °Crisis Services      °[phone]      °[address]     °[place], [place] [address]                ° °[facility]   °[facility] °[phone] °[address] °[place], [place] [address] ° ° °[facility]          °[address]  °[place], [place] [address]       °[phone] ° °[facility] °[address] °[place], [place] [address] °336-292-1510 ° °[facility]    °[address]    °[place], [place] [address]     °[phone]      ° °Parish McKinney, MD     °[address]     °[place] [address]     °336-282-1251     °  °[facility] °[address] °[place] [address] ° °[facility]     °[address]     °[place], [place]      °336-542-2076      ° °[facility] °Shamsher Ahluwalia, MD °[address] °[place], [place] [address] °[phone] ° °[facility]     °[address]     °[place], [place] [address]     °336-274-1237      ° °[facility] °[address] °[place], [place] [address] °336-854-4450 °Resources for Temporary Residential Assistance/Crisis Centers ° °DAY CENTERS °[facility] °M-F 8am-3pm   °[address] [place], [place] [address]   336-332-0824 °Services include: laundry, barbering, support groups, case management, phone  & computer access, showers, AA/NA mtgs, mental health/substance abuse nurse, job skills class, disability information, VA assistance, spiritual classes, etc.  ° °HOMELESS SHELTERS ° °[facility]     °[facility]   °[address], [place]     °[phone]       °       °[facility] (women and children)       °[address] °[place], [place] [address] °336-275-0820 °[email or web address] for application and process °Application Required ° °[facility]   °[address]    °[place] [address]     °[phone]       °             °[facility] °[address] °[place],  [address] °336.273.5572 °336-235-0363(schedule application appt.) °Application Required ° °[facility] (women only)    °[address]     °[place],  [address]     °336-884-1039      °  Intake starts 6pm daily °Need valid ID, SSC, & Police report °[facility] [place] °[address] °[place], [place] °336-881-5420 °Application Required ° °[facility] (men only)     °[address]      °[place], [place]     °[phone]      ° °[facility] °(Pregnant women only) °[address] °[place], [place] °336-275-0206 ° °[facility]      °[address]      °[place], [place] [address]     °[phone]      °       °[facility] °[address] °[place], [place] °336-723-1848 °90 day commitment/SA/Application process ° °[facility](men only)     °[address]     °[place], [place]     °[phone]       °Check-in at 7pm     °       °[facility] °[address] °[place], [place] [address] °336-248-6684 °Men/Women/Women and Children must be there by 7 pm ° °[facility] °[place], [place] °336-722-8721                ° °

## 2016-06-11 MED ORDER — NALOXONE HCL 4 MG/0.1ML NA LIQD
1.0000 | Freq: Once | NASAL | Status: AC
  Administered 2016-06-11: 1 via NASAL
  Filled 2016-06-11: qty 4

## 2016-06-11 NOTE — ED Notes (Signed)
Patient left at this time with all belongings. Sent with home narcan kit.

## 2017-10-12 IMAGING — CT CT HEAD W/O CM
3 of 4 series · 15 of 47 positions shown, 18 images · non-contrast
Comparison: None.

CLINICAL DATA: Heroin overdose.  Possible head injury.

EXAM:
CT HEAD WITHOUT CONTRAST
TECHNIQUE: Contiguous axial images were obtained from the base of the skull
through the vertex without intravenous contrast.

[Series 4: head 2.0 h70h · axial · 0.41mm/px · z∈[+1277,+1401]mm · 9 of 78 slices shown, 12 images]
[im 8/78  brain]
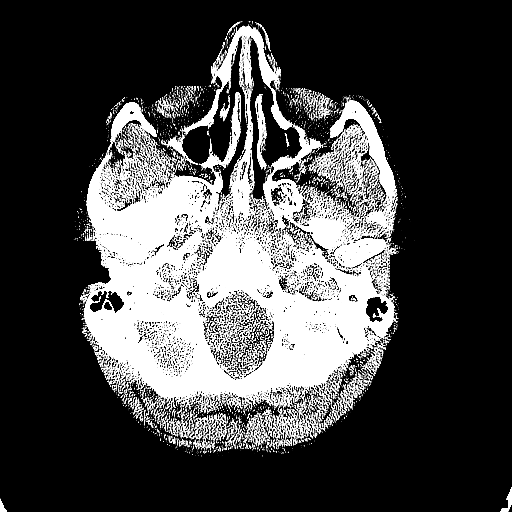
[im 8/78  bone]
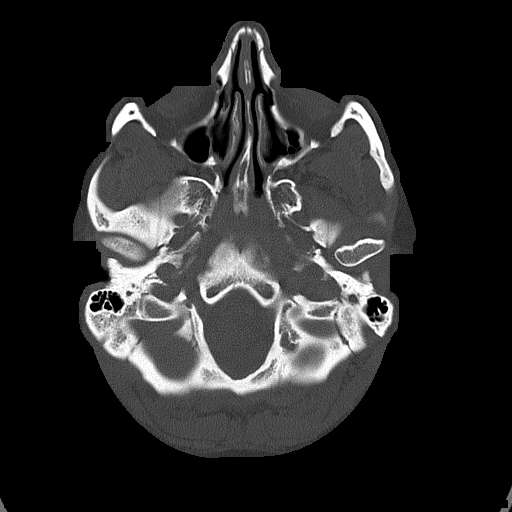
[im 16/78  brain]
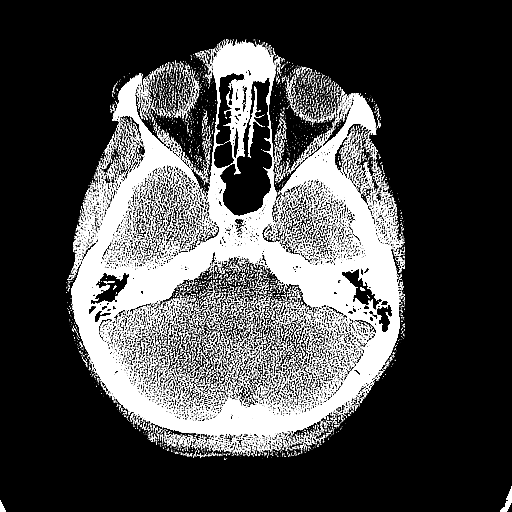
[im 24/78  brain]
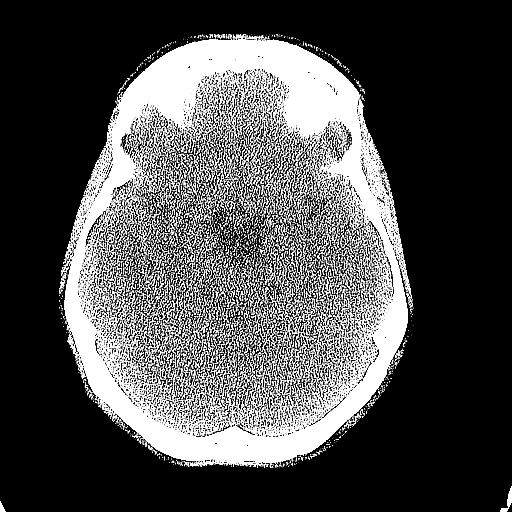
[im 31/78  brain]
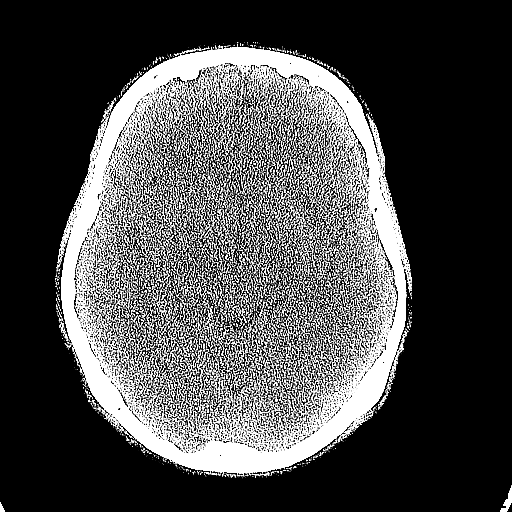
[im 39/78  brain]
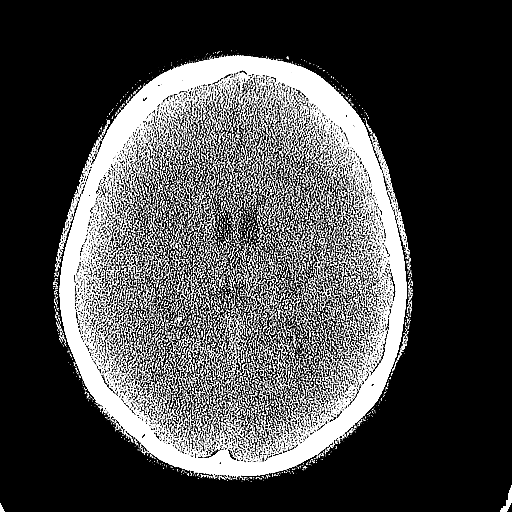
[im 39/78  bone]
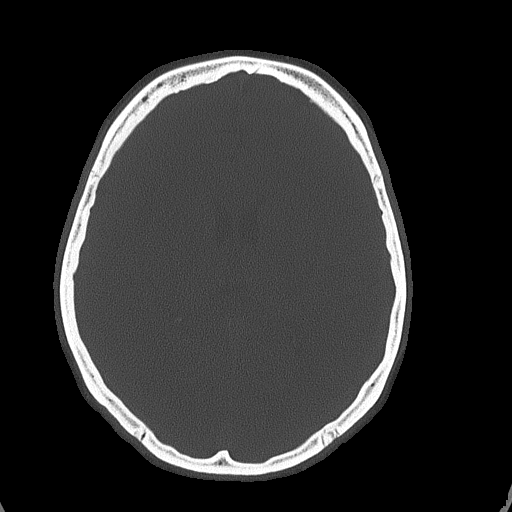
[im 47/78  brain]
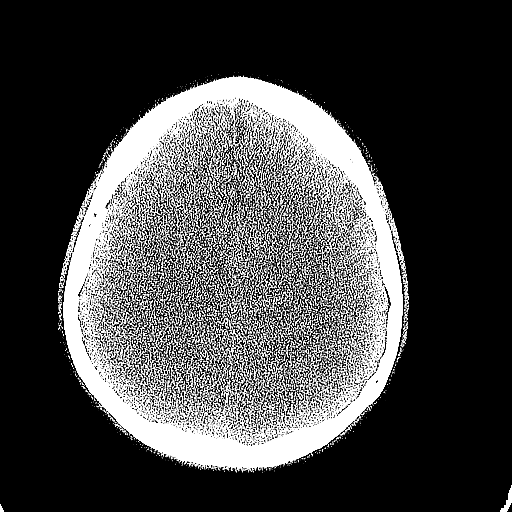
[im 54/78  brain]
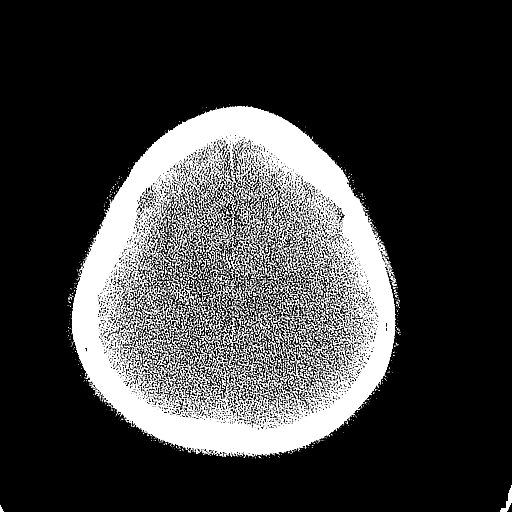
[im 62/78  brain]
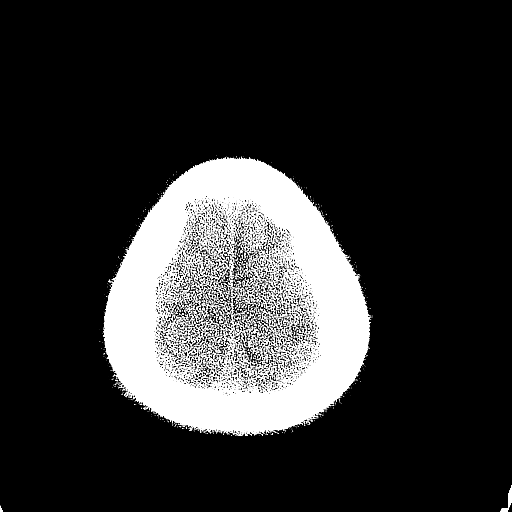
[im 70/78  brain]
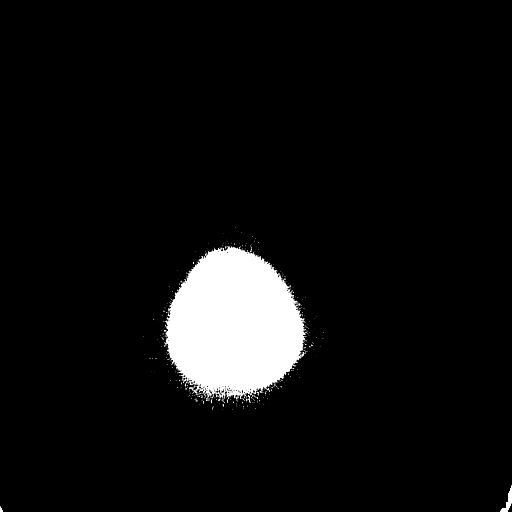
[im 70/78  bone]
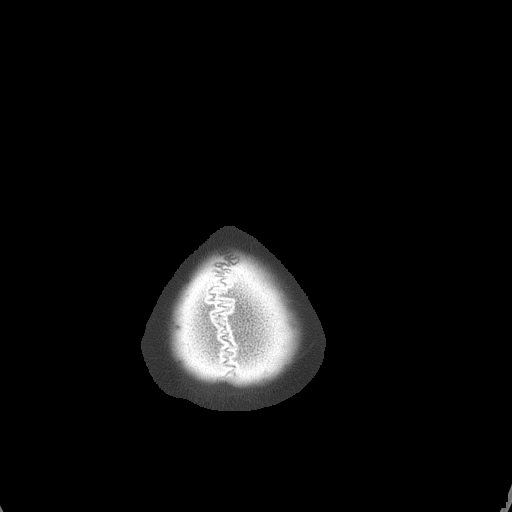

[Series 5: head 3.0 mpr cor · coronal · 0.30mm/px · 3 of 67 slices shown]
[im 23/67  brain]
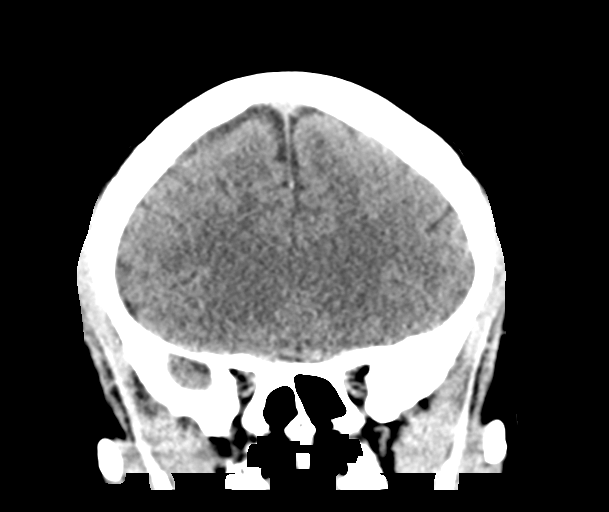
[im 30/67  brain]
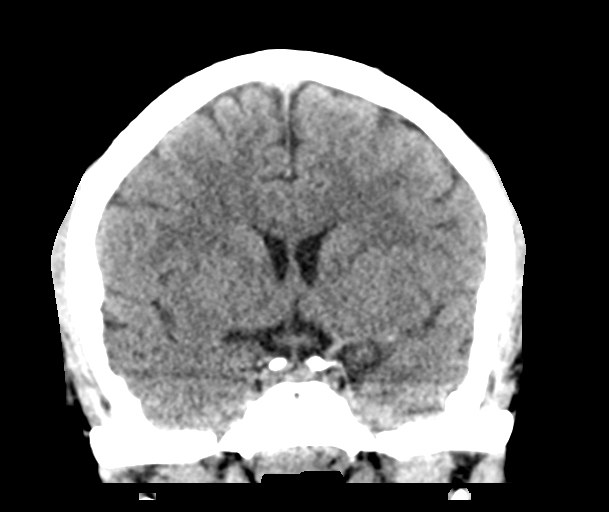
[im 37/67  brain]
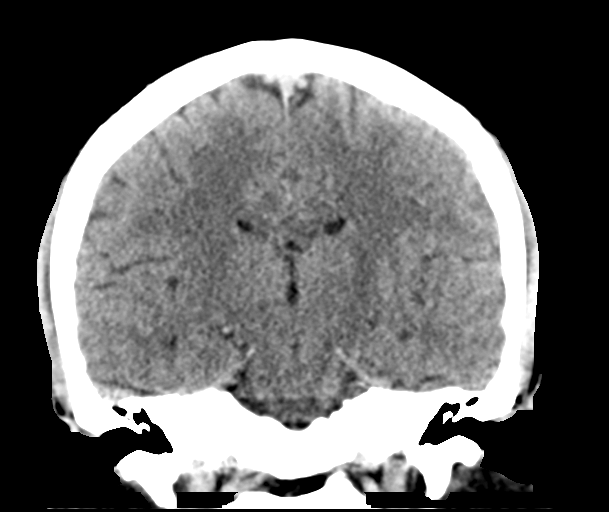

[Series 6: head 3.0 mpr sag · sagittal · 0.32mm/px · 3 of 67 slices shown]
[im 23/67  brain]
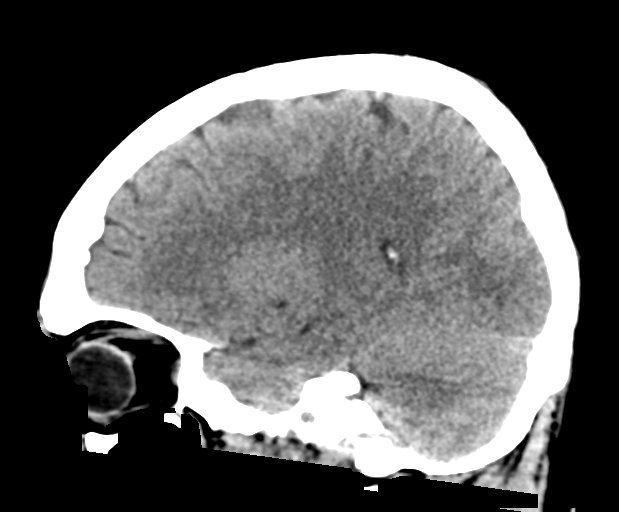
[im 34/67  brain]
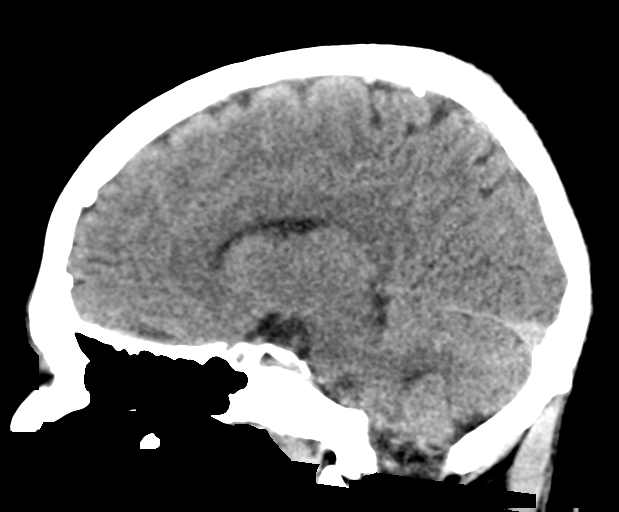
[im 45/67  brain]
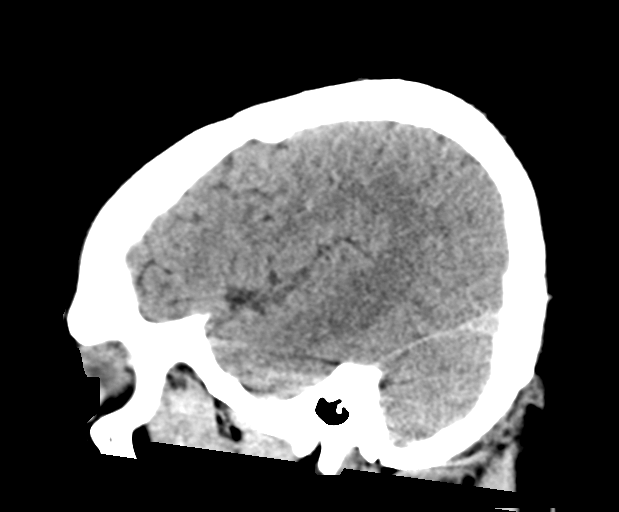

[15 of 47 positions shown; findings below may reference images not displayed]

FINDINGS: Brain: No evidence of acute infarction, hemorrhage, hydrocephalus,
extra-axial collection or mass lesion/mass effect.

Vascular: No hyperdense vessel or unexpected calcification.

Skull: Normal. Negative for fracture or focal lesion.

Sinuses/Orbits: Mucosal thickening seen in the left maxillary sinus.
Paranasal sinuses, mastoid air cells, and middle ears are otherwise
within normal limits.

Other: None.
IMPRESSION: 1. No acute intracranial abnormalities identified.
